# Patient Record
Sex: Female | Born: 1970 | Race: Black or African American | Hispanic: No | Marital: Single | Smoking: Never smoker
Health system: Southern US, Community
[De-identification: ages and names within clinical notes are randomized; demographics above are authoritative.]

## PROBLEM LIST (undated history)

## (undated) DIAGNOSIS — Z8639 Personal history of other endocrine, nutritional and metabolic disease: Secondary | ICD-10-CM

## (undated) DIAGNOSIS — D249 Benign neoplasm of unspecified breast: Secondary | ICD-10-CM

## (undated) DIAGNOSIS — F32A Depression, unspecified: Secondary | ICD-10-CM

## (undated) DIAGNOSIS — K219 Gastro-esophageal reflux disease without esophagitis: Secondary | ICD-10-CM

## (undated) DIAGNOSIS — D649 Anemia, unspecified: Secondary | ICD-10-CM

## (undated) DIAGNOSIS — N841 Polyp of cervix uteri: Secondary | ICD-10-CM

## (undated) DIAGNOSIS — I10 Essential (primary) hypertension: Secondary | ICD-10-CM

## (undated) DIAGNOSIS — F419 Anxiety disorder, unspecified: Secondary | ICD-10-CM

## (undated) DIAGNOSIS — D219 Benign neoplasm of connective and other soft tissue, unspecified: Secondary | ICD-10-CM

## (undated) DIAGNOSIS — N83209 Unspecified ovarian cyst, unspecified side: Secondary | ICD-10-CM

## (undated) DIAGNOSIS — G4700 Insomnia, unspecified: Secondary | ICD-10-CM

## (undated) DIAGNOSIS — R160 Hepatomegaly, not elsewhere classified: Secondary | ICD-10-CM

## (undated) DIAGNOSIS — F329 Major depressive disorder, single episode, unspecified: Secondary | ICD-10-CM

## (undated) DIAGNOSIS — Z90721 Acquired absence of ovaries, unilateral: Secondary | ICD-10-CM

## (undated) DIAGNOSIS — M1990 Unspecified osteoarthritis, unspecified site: Secondary | ICD-10-CM

## (undated) HISTORY — DX: Unspecified ovarian cyst, unspecified side: N83.209

## (undated) HISTORY — DX: Hepatomegaly, not elsewhere classified: R16.0

## (undated) HISTORY — DX: Personal history of other endocrine, nutritional and metabolic disease: Z86.39

## (undated) HISTORY — DX: Polyp of cervix uteri: N84.1

## (undated) HISTORY — DX: Benign neoplasm of unspecified breast: D24.9

## (undated) HISTORY — DX: Insomnia, unspecified: G47.00

## (undated) HISTORY — DX: Benign neoplasm of connective and other soft tissue, unspecified: D21.9

## (undated) HISTORY — PX: COLON SURGERY: SHX602

---

## 1990-02-27 HISTORY — PX: ECTOPIC PREGNANCY SURGERY: SHX613

## 2003-03-29 ENCOUNTER — Other Ambulatory Visit: Payer: Self-pay

## 2004-02-28 HISTORY — PX: MYOMECTOMY: SHX85

## 2004-12-21 ENCOUNTER — Inpatient Hospital Stay (HOSPITAL_COMMUNITY): Admission: RE | Admit: 2004-12-21 | Discharge: 2004-12-23 | Payer: Self-pay | Admitting: Obstetrics and Gynecology

## 2004-12-21 ENCOUNTER — Encounter (INDEPENDENT_AMBULATORY_CARE_PROVIDER_SITE_OTHER): Payer: Self-pay | Admitting: Specialist

## 2005-04-02 ENCOUNTER — Emergency Department: Payer: Self-pay | Admitting: Emergency Medicine

## 2005-04-10 ENCOUNTER — Other Ambulatory Visit: Admission: RE | Admit: 2005-04-10 | Discharge: 2005-04-10 | Payer: Self-pay | Admitting: Obstetrics and Gynecology

## 2005-04-19 ENCOUNTER — Emergency Department: Payer: Self-pay | Admitting: Emergency Medicine

## 2006-05-14 ENCOUNTER — Encounter: Admission: RE | Admit: 2006-05-14 | Discharge: 2006-05-14 | Payer: Self-pay | Admitting: Obstetrics and Gynecology

## 2007-12-29 DIAGNOSIS — R160 Hepatomegaly, not elsewhere classified: Secondary | ICD-10-CM

## 2007-12-29 HISTORY — DX: Hepatomegaly, not elsewhere classified: R16.0

## 2008-01-21 ENCOUNTER — Emergency Department: Payer: Self-pay | Admitting: Internal Medicine

## 2008-01-24 ENCOUNTER — Ambulatory Visit: Payer: Self-pay | Admitting: Internal Medicine

## 2008-03-30 ENCOUNTER — Ambulatory Visit: Payer: Self-pay | Admitting: Gastroenterology

## 2008-07-31 ENCOUNTER — Ambulatory Visit: Payer: Self-pay | Admitting: Gastroenterology

## 2009-06-07 ENCOUNTER — Encounter: Admission: RE | Admit: 2009-06-07 | Discharge: 2009-06-07 | Payer: Self-pay | Admitting: Obstetrics and Gynecology

## 2009-07-01 ENCOUNTER — Emergency Department: Payer: Self-pay | Admitting: Emergency Medicine

## 2009-08-09 ENCOUNTER — Encounter: Admission: RE | Admit: 2009-08-09 | Discharge: 2009-08-09 | Payer: Self-pay | Admitting: Obstetrics and Gynecology

## 2009-11-11 DIAGNOSIS — D249 Benign neoplasm of unspecified breast: Secondary | ICD-10-CM | POA: Insufficient documentation

## 2010-03-20 ENCOUNTER — Encounter: Payer: Self-pay | Admitting: Obstetrics and Gynecology

## 2010-03-22 ENCOUNTER — Encounter
Admission: RE | Admit: 2010-03-22 | Discharge: 2010-03-22 | Payer: Self-pay | Source: Home / Self Care | Attending: Obstetrics and Gynecology | Admitting: Obstetrics and Gynecology

## 2010-05-13 ENCOUNTER — Other Ambulatory Visit: Payer: Self-pay | Admitting: Obstetrics and Gynecology

## 2010-05-13 DIAGNOSIS — Z09 Encounter for follow-up examination after completed treatment for conditions other than malignant neoplasm: Secondary | ICD-10-CM

## 2010-06-13 ENCOUNTER — Other Ambulatory Visit: Payer: Self-pay

## 2010-07-15 NOTE — H&P (Signed)
NAME:  [name], BRIER NO.:  [id number]   MEDICAL RECORD NO.:  [id number]          PATIENT TYPE:  INP   LOCATION:  NA                            FACILITY:  [facility]   PHYSICIAN:  Hal Morales, M.D.DATE OF BIRTH:  1971-02-21   DATE OF ADMISSION:  12/21/2004  DATE OF DISCHARGE:                                HISTORY & PHYSICAL   HISTORY OF PRESENT ILLNESS:  [name] is a 40 year old, single, African-  American female who presents for myomectomy because of uterine fibroids,  menorrhagia, and dysmenorrhea.  For the past 3 years, the patient has  complained of menstrual periods lasting approximately 14 days.  During that  time, she changes 2 super plus tampons every 40 minutes until the last 5  days of this flow when she changes it every 4 hours.  Accompanying her  periods is severe cramping which she rates as a 10/10 on a 10-point scale;  however, the patient has chosen to simply endure this discomfort.  She  admits to dyspareunia, and today a malodorous vaginal discharge; however,  she denies any changes in bowel habits, urinary tract symptoms, or fever.  Her TSH is June 2006 was within normal limits, and a pelvic  ultrasound/sonohistogram at that time revealed 2 uterine fibroids measuring  4.9 x 4.1 x 4 cm and 2.3 x 2.4 x 1.9 cm, respectively.  A left simple  ovarian cyst measuring 3.2 cm was also observed, and the sonohistogram  failed to show any evidence of endometrial lesions.  Due to patient's  history of hypertension, she has been placed on Micronor in an effort to  curtail her bleeding and cramping; however, she has not had any change in  her symptoms whatsoever.  A review of management options for patient's  symptoms were given to include the Mirena IUD, Depo-Provera, myomectomy,  hysterectomy, and uterine artery embolization.  Since the patient desires to  preserve her fertility, she has chosen to proceed with myomectomy for  symptomatic therapy.   OBSTETRICAL HISTORY:  Gravida 2, para 1-0-1-1. The patient had a spontaneous  vaginal delivery in 1986.   GYNECOLOGICAL HISTORY:  Menarche 40 years old.  Last menstrual period  December 02, 2004 (see History of Present Illness for menstrual  characteristics).  Contraception: Micronor.  The patient does have a remote  history of an abnormal Pap smear; however, since 1992, all of her Pap smears  have been normal.  She denies any history of sexually transmitted diseases.  Her last normal Pap smear was January 2006.   PAST MEDICAL HISTORY:  1.  Hypertension.  2.  Hypercholesterolemia.  3.  Anemia.  4.  Depression.  5.  Anxiety attacks.   PAST SURGICAL HISTORY:  In 1992, laparotomy for a left ectopic pregnancy.  The patient denies any problems with anesthesia or history of blood  transfusion.   FAMILY HISTORY:  Cancer (pancreatic), hypertension, stroke, and diabetes  mellitus.   SOCIAL HISTORY:  The patient is disabled secondary to her depression and  anxiety disorders, and she is single.  Habits: She does not use alcohol or  tobacco.  CURRENT MEDICATIONS:  1.  Remeron 30 mg daily.  2.  Zoloft 50 mg daily.  3.  Iron 1 tablet daily.  4.  Micronor 1 tablet daily.  5.  Lipitor 20 mg daily.  6.  Hydrochlorothiazide 25 mg daily.   ALLERGIES:  The patient is allergic to TETRACYCLINE and SULFA, both of which  cause hives.   PHYSICAL EXAMINATION:  VITAL SIGNS: Blood pressure 114/70, weight 156,  height 4 feet 11-3/4 inches.  NECK:  Supple without masses.  There is no adenopathy or thyromegaly.  HEART: Regular rate and rhythm.  There is no murmur.  LUNGS:  Clear to auscultation.  There are no wheezes, rales, or rhonchi.  BACK: No CVA tenderness.  ABDOMEN:  Bowel sounds are present.  It is soft without tenderness,  guarding, rebound, or organomegaly.  EXTREMITIES:  Without clubbing, cyanosis, or edema.  PELVIC: EG/BUS within normal limits.  Vagina is rugose.  Cervix is nontender   without lesions.  Uterus is 14 to 15-week size without tenderness and  irregular.  Adnexa without tenderness or masses.  Rectovaginal without  tenderness or masses.   LABORATORY DATA:  Wet prep:  pH 4.5, negative whiff, negative for clue  cells, yeast, or trichomonas.   IMPRESSION:  1.  Fibroid uterus.  2.  Menorrhagia.  3.  Dysmenorrhea.  4.  Physiologic leukorrhea.   DISPOSITION:  A discussion was held with patient as previously outlined  regarding the management options for her presenting symptoms, and she has  decided to proceed with myomectomy. The patient as given a list of the risks  associated with this procedure which included but are not limited to  reaction to anesthesia, damage to adjacent organs, infection, and excessive  bleeding. The specific risk of the need for hysterectomy at the time of  intended myomectomy was likewise reviewed. The patient has consented to  proceed with abdominal myomectomy at [facility] on  December 21, 2004, at 7:30 a.m.      [name].      Hal Morales, M.D.  Electronically Signed    EJP/MEDQ  D:  12/12/2004  T:  12/12/2004  Job:  [id number]

## 2010-07-15 NOTE — Discharge Summary (Signed)
NAMEMIGUEL, MEDAL NO.:  [id number]   MEDICAL RECORD NO.:  [id number]          PATIENT TYPE:  INP   LOCATION:  9308                          [facility]ACILITY:  [facility]   PHYSICIAN:  Hal Morales, M.D.DATE OF BIRTH:  04/01/1970   DATE OF ADMISSION:  12/21/2004  DATE OF DISCHARGE:  12/23/2004                                 DISCHARGE SUMMARY   DISCHARGE DIAGNOSIS:  Fibroid uterus, menorrhagia, pelvic pain.   OPERATION:  On the day of admission the patient underwent an abdominal  myomectomy with lysis of adhesions and biopsy of a posterior uterine serosa.  The patient was found to have a uterus enlarged with two myomas measuring  4.5 cm x 5 cm on the posterior fundus, and a 2 cm x 2 cm myoma on the right  anterior fundus near the tubal junction. The right tube and ovary were  within normal limits except for follicular cyst on the right ovary. The left  tube and ovary were involved with filmy adhesions, with the adhesions  completely encasing the left ovary and adherent to the posterior uterus at  the site where the powder burn lesion consistent with endometriosis was  present.   HISTORY OF PRESENT ILLNESS:  Ms. Creekmore is a 40 year old single African-  American female para 1-0-1-1 who presents for myomectomy because of uterine  fibroids, menorrhagia and dysmenorrhea. Please see the patient's dictated  history and physical examination for details.   PREOPERATIVE PHYSICAL EXAMINATION:  VITAL SIGNS:  Blood pressure 114/70,  weight is 156, height is 4 feet 11 and three-quarter inches tall.  GENERAL:  Within normal limits.  PELVIC:  EG/BUS was within normal limits. Vagina is rugose. Cervix is  nontender without lesions. Uterus is 14-15 weeks size without tenderness and  irregular. Adnexa without tenderness or masses. Rectovaginal without  tenderness or masses.   HOSPITAL COURSE:  On the day of admission the patient underwent  aforementioned procedures, tolerating them  all well. Postoperative course  was unremarkable with the patient tolerating a postoperative hemoglobin of  9.2 (preoperative hemoglobin 12.0). By postoperative day #2, the patient had  resumed bowel and bladder function and was therefore deemed ready for  discharge home.   DISCHARGE MEDICATIONS:  1.  Iron one tablet twice daily for 6 weeks.  2.  Tylox one or two tablets every 4 hours as needed for pain.  3.  Ibuprofen 600 mg one tablet with food every 6 hours for 3 days, then as      needed for pain.  4.  Phenergan 25 mg one tablet every 6 hours for nausea.  5.  Colace 100 mg one tablet twice daily until bowel movements are regular.  6.  HydroDIURIL 25 mg daily.  7.  Lipitor 20 mg daily.  8.  Zoloft 50 mg daily.  9.  Remeron 30 mg daily.  10. Micronor one tablet daily.   FOLLOW-UP:  The patient is scheduled for a 6 weeks postoperative visit with  Dr. Pennie Rushing on February 02, 2005, at 11:30 a.m. The patient was also advised  to present to [facility] on  December 28, 2004, at 11:00 a.m. for  staple removal.   DISCHARGE INSTRUCTIONS:  The patient was given a copy of [facility] postoperative instruction sheet. She was further advised to avoid  driving for 2 weeks, heavy lifting for 4 weeks, intercourse for 6 weeks,  that she may walk up steps, that she may shower. The patient's diet was  without restriction.   FINAL PATHOLOGY:  Uterus, myomectomy:  Leiomyomata. Uterine serosa posterior  biopsy:  Fibrosis, no endometriosis identified.      [name].      Hal Morales, M.D.  Electronically Signed    EJP/MEDQ  D:  01/13/2005  T:  01/13/2005  Job:  [id number]

## 2010-07-15 NOTE — Op Note (Signed)
[name], [name] NO.:  [id number]   MEDICAL RECORD NO.:  [id number]          PATIENT TYPE:  INP   LOCATION:  9399                          FACILITY:  [facility]   PHYSICIAN:  Hal Morales, M.D.DATE OF BIRTH:  1970-03-15   DATE OF PROCEDURE:  12/21/2004  DATE OF DISCHARGE:                                 OPERATIVE REPORT   PREOPERATIVE DIAGNOSES:  Uterine fibroids, menorrhagia, pelvic pain and  anemia.   POSTOPERATIVE DIAGNOSES:  Uterine fibroids, menorrhagia, pelvic pain and  anemia.  Plus rule out endometriosis.   PROCEDURE:  Abdominal myomectomy, lysis of adhesions, biopsy of posterior  uterine serosa.   SURGEON:  Hal Morales, M.D.   FIRST ASSISTANT:  [name].   ANESTHESIA:  General orotracheal.   ESTIMATED BLOOD LOSS:  150 mL.   COMPLICATIONS:  None.   URINE OUTPUT:  200 mL   IV FLUIDS:  1500 mL.   FINDINGS:  The uterus was enlarged with two myomata, a 4.5 x 5 cm myoma on  the posterior fundus and a 2 x 2 cm myoma on the right anterior fundus near  the tubal junction. The right tube and ovary were within normal limits  except for follicular cyst on the right ovary. The left tube and ovary were  involved in filmy adhesions with the adhesions completely encasing the left  ovary and adherent to the posterior uterus at the site where a powder burn  lesion consistent with endometriosis was present.   PROCEDURE:  The patient was taken to the operating room after appropriate  identification and placed on the operating table. After the attainment of  adequate general anesthesia the abdomen, perineum and vagina were prepped  with multiple layers of Betadine. A Foley catheter was inserted into the  bladder under sterile conditions and connected to straight drainage. The  abdomen was draped as a sterile field. Injection of 15 mL of 0.25% Marcaine  at the site of the previous transverse abdominal incision was performed. A  transverse  incision was made at that site and the abdomen opened in layers.  The peritoneum was entered. The anterior peritoneal surface was adherent to  the underlying omentum and lysis of adhesions was carried out to free this.  The uterus was then freed of adhesions and elevated into the operative  field. It was surrounded with laparotomy pads. The posterior uterine serosa  was infiltrated with a dilute solution of Pitressin and incised over the  posterior myoma. A combination of sharp and blunt dissection allowed  excision of that myoma with care being taken to dissect off the endometrial  surface that was apparent. That myoma bed was then closed with interrupted  sutures of 0 Vicryl. Mattress sutures of 2-0 Vicryl allowed reapproximation  of the uterine serosa and adequate hemostasis. The right anterior fundal  myoma was then identified. The overlying serosa was injected with a dilute  solution of Pitressin and incised. A combination of blunt and sharp  dissection allowed removal of that myoma. The myoma bed was closed with  interrupted sutures of 2-0 Vicryl and the  serosa reapproximated with  mattress sutures of 2-0 Vicryl. Hemostasis was noted to be adequate. Lysis  of adhesions was then carried out around the left fallopian tube to expose  the left ovary which was normal in appearance. The powder burn lesion on the  posterior uterine serosa near the junction of the uterosacral ligaments was  then excised with hot cautery and hemostasis noted to be adequate. That was  sent for pathologic evaluation. Copious irrigation was carried out.  Intercede was used to cover the incisions in the posterior uterus and  anterior uterine serosa and the uterus returned to the peritoneal cavity.  The abdominal peritoneum was then closed with a running suture of 2-0  Vicryl. The rectus muscles were made hemostatic with Bovie cautery. After  irrigation. The rectus fascia was closed with running sutures of 0  Vicryl  from each apex to the midline and tied in the midline. The subcutaneous  tissue was made hemostatic with Bovie cautery and skin staples applied to  the skin incision. A 0.25% Marcaine was again injected the in the closed  incision for total of 15 mL. A sterile dressing was applied to the incision  and the patient awakened from general anesthesia and taken to the recovery  room in satisfactory condition having tolerated the procedure well with  sponge and instrument counts correct. Specimens to pathology; two uterine  fibroids and posterior uterine serosal biopsy.      Hal Morales, M.D.  Electronically Signed     VPH/MEDQ  D:  12/21/2004  T:  12/21/2004  Job:  [id number]

## 2010-07-28 ENCOUNTER — Ambulatory Visit
Admission: RE | Admit: 2010-07-28 | Discharge: 2010-07-28 | Disposition: A | Discharge status: Home / Self Care | Payer: Medicare Other | Source: Ambulatory Visit | Attending: Obstetrics and Gynecology | Admitting: Obstetrics and Gynecology

## 2010-07-28 DIAGNOSIS — Z09 Encounter for follow-up examination after completed treatment for conditions other than malignant neoplasm: Secondary | ICD-10-CM

## 2010-10-24 ENCOUNTER — Other Ambulatory Visit: Payer: Self-pay | Admitting: Obstetrics and Gynecology

## 2010-11-18 NOTE — Patient Instructions (Addendum)
   Your procedure is scheduled on: Tuesday, 10/2  Enter through the Main Entrance of [facility] at: 7:30am Pick up the phone at the desk and dial [phone]  Please call this number if you have any problems the morning of surgery: [phone]  Remember: Do not eat food after midnight  Do not drink clear liquids after:midnight Take these medicines the morning of surgery with a SIP OF WATER: meds: nexium, b/p medicine  Do not wear jewelry, make-up, or FINGER nail polish Do not wear lotions, powders, or perfumes.no deodorants Do not shave 48 hours prior to surgery. Do not bring valuables to the hospital. Leave suitcase in the car. After Surgery it may be brought to your room. For patients being admitted to the hospital, checkout time is 11:00am the day of discharge.    Remember to use your hibiclens as instructed. Shower with 1/2 bottle the evening before surgery and the other 1/2 bottle morning of surgery

## 2010-11-22 ENCOUNTER — Other Ambulatory Visit: Payer: Self-pay

## 2010-11-22 ENCOUNTER — Encounter (HOSPITAL_COMMUNITY)
Admission: RE | Admit: 2010-11-22 | Discharge: 2010-11-22 | Disposition: A | Discharge status: Home / Self Care | Payer: Medicare Other | Source: Ambulatory Visit | Attending: Obstetrics and Gynecology | Admitting: Obstetrics and Gynecology

## 2010-11-22 ENCOUNTER — Encounter (HOSPITAL_COMMUNITY): Payer: Self-pay

## 2010-11-22 HISTORY — DX: Gastro-esophageal reflux disease without esophagitis: K21.9

## 2010-11-22 HISTORY — DX: Anemia, unspecified: D64.9

## 2010-11-22 HISTORY — DX: Essential (primary) hypertension: I10

## 2010-11-22 HISTORY — DX: Unspecified osteoarthritis, unspecified site: M19.90

## 2010-11-22 HISTORY — DX: Anxiety disorder, unspecified: F41.9

## 2010-11-22 LAB — BASIC METABOLIC PANEL
CO2: 30 mEq/L (ref 19–32)
Calcium: 9.1 mg/dL (ref 8.4–10.5)
Chloride: 102 mEq/L (ref 96–112)
Creatinine, Ser: 0.61 mg/dL (ref 0.50–1.10)
GFR calc Af Amer: >60 mL/min (ref >60)
Potassium: 3.4 mEq/L — ABNORMAL LOW (ref 3.5–5.1)
Sodium: 138 mEq/L (ref 135–145)

## 2010-11-22 LAB — CBC
Hemoglobin: 11 g/dL — ABNORMAL LOW (ref 12.0–15.0)
MCHC: 33.2 g/dL (ref 30.0–36.0)
MCV: 89.5 fL (ref 78.0–100.0)
WBC: 4 10*3/uL (ref 4.0–10.5)

## 2010-11-22 LAB — SURGICAL PCR SCREEN: Staphylococcus aureus: NEGATIVE

## 2010-11-24 ENCOUNTER — Other Ambulatory Visit: Payer: Self-pay | Admitting: Obstetrics and Gynecology
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
## 2010-11-28 ENCOUNTER — Other Ambulatory Visit: Payer: Self-pay | Admitting: Obstetrics and Gynecology

## 2010-11-28 MED ORDER — DEXTROSE 5 % IV SOLN
2.0000 g | INTRAVENOUS | Status: AC
  Administered 2010-11-29: 2 g via INTRAVENOUS
  Filled 2010-11-28: qty 2

## 2010-11-29 ENCOUNTER — Encounter (HOSPITAL_COMMUNITY)
Admission: RE | Disposition: A | Discharge status: Home / Self Care | Payer: Self-pay | Source: Ambulatory Visit | Attending: Obstetrics and Gynecology

## 2010-11-29 ENCOUNTER — Ambulatory Visit (HOSPITAL_COMMUNITY): Payer: Medicare Other | Admitting: Anesthesiology

## 2010-11-29 ENCOUNTER — Ambulatory Visit (HOSPITAL_COMMUNITY)
Admission: RE | Admit: 2010-11-29 | Discharge: 2010-11-30 | Disposition: A | Discharge status: Home / Self Care | Payer: Medicare Other | Source: Ambulatory Visit | Attending: Obstetrics and Gynecology | Admitting: Obstetrics and Gynecology

## 2010-11-29 ENCOUNTER — Encounter (HOSPITAL_COMMUNITY): Payer: Self-pay | Admitting: Obstetrics and Gynecology

## 2010-11-29 ENCOUNTER — Encounter (HOSPITAL_COMMUNITY): Payer: Self-pay | Admitting: Anesthesiology

## 2010-11-29 ENCOUNTER — Other Ambulatory Visit: Payer: Self-pay | Admitting: Obstetrics and Gynecology

## 2010-11-29 DIAGNOSIS — D250 Submucous leiomyoma of uterus: Secondary | ICD-10-CM | POA: Insufficient documentation

## 2010-11-29 DIAGNOSIS — Z01812 Encounter for preprocedural laboratory examination: Secondary | ICD-10-CM | POA: Insufficient documentation

## 2010-11-29 DIAGNOSIS — D251 Intramural leiomyoma of uterus: Secondary | ICD-10-CM | POA: Insufficient documentation

## 2010-11-29 DIAGNOSIS — D219 Benign neoplasm of connective and other soft tissue, unspecified: Secondary | ICD-10-CM

## 2010-11-29 DIAGNOSIS — K219 Gastro-esophageal reflux disease without esophagitis: Secondary | ICD-10-CM | POA: Diagnosis present

## 2010-11-29 DIAGNOSIS — N736 Female pelvic peritoneal adhesions (postinfective): Secondary | ICD-10-CM | POA: Insufficient documentation

## 2010-11-29 DIAGNOSIS — I10 Essential (primary) hypertension: Secondary | ICD-10-CM | POA: Insufficient documentation

## 2010-11-29 DIAGNOSIS — N920 Excessive and frequent menstruation with regular cycle: Secondary | ICD-10-CM | POA: Diagnosis present

## 2010-11-29 DIAGNOSIS — D252 Subserosal leiomyoma of uterus: Secondary | ICD-10-CM | POA: Insufficient documentation

## 2010-11-29 DIAGNOSIS — Z01818 Encounter for other preprocedural examination: Secondary | ICD-10-CM | POA: Insufficient documentation

## 2010-11-29 HISTORY — PX: LAPAROSCOPIC ASSISTED VAGINAL HYSTERECTOMY: SHX5398

## 2010-11-29 HISTORY — PX: ABDOMINAL HYSTERECTOMY: SHX81

## 2010-11-29 LAB — CBC
Hemoglobin: 10 g/dL — ABNORMAL LOW (ref 12.0–15.0)
MCHC: 33 g/dL (ref 30.0–36.0)
RBC: 3.41 MIL/uL — ABNORMAL LOW (ref 3.87–5.11)
WBC: 11 10*3/uL — ABNORMAL HIGH (ref 4.0–10.5)

## 2010-11-29 SURGERY — HYSTERECTOMY, VAGINAL, LAPAROSCOPY-ASSISTED
Anesthesia: General

## 2010-11-29 MED ORDER — DIPHENHYDRAMINE HCL 50 MG/ML IJ SOLN: 12.5000 mg | Freq: Four times a day (QID) | INTRAMUSCULAR | Status: DC | PRN

## 2010-11-29 MED ORDER — HYDROMORPHONE 0.3 MG/ML IV SOLN
INTRAVENOUS | Status: AC
  Filled 2010-11-29: qty 25

## 2010-11-29 MED ORDER — LIDOCAINE HCL (CARDIAC) 20 MG/ML IV SOLN
INTRAVENOUS | Status: DC | PRN
  Administered 2010-11-29: 60 mg via INTRAVENOUS

## 2010-11-29 MED ORDER — VASOPRESSIN 20 UNIT/ML IJ SOLN
INTRAVENOUS | Status: DC | PRN
  Administered 2010-11-29: 12:00:00 via INTRAMUSCULAR

## 2010-11-29 MED ORDER — DEXAMETHASONE SODIUM PHOSPHATE 10 MG/ML IJ SOLN
INTRAMUSCULAR | Status: AC
  Filled 2010-11-29: qty 1

## 2010-11-29 MED ORDER — SCOPOLAMINE 1 MG/3DAYS TD PT72
1.0000 | MEDICATED_PATCH | Freq: Once | TRANSDERMAL | Status: DC
Start: 2010-11-29 — End: 2010-11-29
  Administered 2010-11-29: 1.5 mg via TRANSDERMAL

## 2010-11-29 MED ORDER — LACTATED RINGERS IV SOLN
INTRAVENOUS | Status: DC
  Administered 2010-11-29: 125 mL/h via INTRAVENOUS
  Administered 2010-11-29: via INTRAVENOUS

## 2010-11-29 MED ORDER — DIPHENHYDRAMINE HCL 12.5 MG/5ML PO ELIX: 12.5000 mg | ORAL_SOLUTION | Freq: Four times a day (QID) | ORAL | Status: DC | PRN

## 2010-11-29 MED ORDER — HYDROMORPHONE 0.3 MG/ML IV SOLN
INTRAVENOUS | Status: DC
  Administered 2010-11-29: 0.799 mg via INTRAVENOUS
  Administered 2010-11-29: 16:00:00 via INTRAVENOUS
  Administered 2010-11-29: 0.65 mL via INTRAVENOUS
  Administered 2010-11-30: 0.599 mg via INTRAVENOUS
  Administered 2010-11-30: 0.8 mg via INTRAVENOUS

## 2010-11-29 MED ORDER — SODIUM CHLORIDE 0.9 % IJ SOLN: 9.0000 mL | INTRAMUSCULAR | Status: DC | PRN

## 2010-11-29 MED ORDER — MIDAZOLAM HCL 2 MG/2ML IJ SOLN
INTRAMUSCULAR | Status: AC
  Filled 2010-11-29: qty 2

## 2010-11-29 MED ORDER — ONDANSETRON HCL 4 MG/2ML IJ SOLN
INTRAMUSCULAR | Status: DC | PRN
  Administered 2010-11-29: 4 mg via INTRAVENOUS

## 2010-11-29 MED ORDER — MENTHOL 3 MG MT LOZG: 1.0000 | LOZENGE | OROMUCOSAL | Status: DC | PRN

## 2010-11-29 MED ORDER — MEPERIDINE HCL 25 MG/ML IJ SOLN: 6.2500 mg | INTRAMUSCULAR | Status: DC | PRN

## 2010-11-29 MED ORDER — PROMETHAZINE HCL 25 MG RE SUPP: 12.5000 mg | Freq: Four times a day (QID) | RECTAL | Status: DC | PRN

## 2010-11-29 MED ORDER — HYDROCHLOROTHIAZIDE 25 MG PO TABS
25.0000 mg | ORAL_TABLET | Freq: Every day | ORAL | Status: DC
  Administered 2010-11-30: 25 mg via ORAL
  Filled 2010-11-29 (×2): qty 1

## 2010-11-29 MED ORDER — FENTANYL CITRATE 0.05 MG/ML IJ SOLN
INTRAMUSCULAR | Status: AC
  Administered 2010-11-29: 50 ug via INTRAVENOUS
  Filled 2010-11-29: qty 2

## 2010-11-29 MED ORDER — PROPOFOL 10 MG/ML IV EMUL
INTRAVENOUS | Status: AC
  Filled 2010-11-29: qty 20

## 2010-11-29 MED ORDER — METOCLOPRAMIDE HCL 5 MG/ML IJ SOLN: 10.0000 mg | Freq: Once | INTRAMUSCULAR | Status: DC | PRN

## 2010-11-29 MED ORDER — BUPIVACAINE HCL (PF) 0.25 % IJ SOLN
INTRAMUSCULAR | Status: DC | PRN
  Administered 2010-11-29: 10 mL

## 2010-11-29 MED ORDER — SIMVASTATIN 40 MG PO TABS
40.0000 mg | ORAL_TABLET | Freq: Every day | ORAL | Status: DC
  Administered 2010-11-29: 40 mg via ORAL
  Filled 2010-11-29 (×2): qty 1

## 2010-11-29 MED ORDER — SCOPOLAMINE 1 MG/3DAYS TD PT72
MEDICATED_PATCH | TRANSDERMAL | Status: AC
  Administered 2010-11-29: 1.5 mg via TRANSDERMAL
  Filled 2010-11-29: qty 1

## 2010-11-29 MED ORDER — PANTOPRAZOLE SODIUM 40 MG PO TBEC
80.0000 mg | DELAYED_RELEASE_TABLET | Freq: Every day | ORAL | Status: DC
  Administered 2010-11-30: 80 mg via ORAL
  Filled 2010-11-29 (×3): qty 2

## 2010-11-29 MED ORDER — PROPOFOL 10 MG/ML IV EMUL
INTRAVENOUS | Status: DC | PRN
  Administered 2010-11-29: 50 mg via INTRAVENOUS
  Administered 2010-11-29: 150 mg via INTRAVENOUS
  Administered 2010-11-29: 50 mg via INTRAVENOUS

## 2010-11-29 MED ORDER — FENTANYL CITRATE 0.05 MG/ML IJ SOLN
INTRAMUSCULAR | Status: AC
  Filled 2010-11-29: qty 5

## 2010-11-29 MED ORDER — GLYCOPYRROLATE 0.2 MG/ML IJ SOLN
INTRAMUSCULAR | Status: DC | PRN
  Administered 2010-11-29: 0.2 mg via INTRAVENOUS

## 2010-11-29 MED ORDER — FENTANYL CITRATE 0.05 MG/ML IJ SOLN
25.0000 ug | INTRAMUSCULAR | Status: DC | PRN
  Administered 2010-11-29: 50 ug via INTRAVENOUS

## 2010-11-29 MED ORDER — NEOSTIGMINE METHYLSULFATE 1 MG/ML IJ SOLN
INTRAMUSCULAR | Status: AC
  Filled 2010-11-29: qty 10

## 2010-11-29 MED ORDER — FENTANYL CITRATE 0.05 MG/ML IJ SOLN
INTRAMUSCULAR | Status: DC | PRN
  Administered 2010-11-29 (×2): 100 ug via INTRAVENOUS
  Administered 2010-11-29 (×2): 50 ug via INTRAVENOUS
  Administered 2010-11-29: 150 ug via INTRAVENOUS

## 2010-11-29 MED ORDER — HYDROCODONE-ACETAMINOPHEN 5-325 MG PO TABS
1.0000 | ORAL_TABLET | Freq: Four times a day (QID) | ORAL | Status: DC | PRN
  Administered 2010-11-30: 1 via ORAL
  Filled 2010-11-29: qty 1

## 2010-11-29 MED ORDER — HYDROMORPHONE HCL 1 MG/ML IJ SOLN
INTRAMUSCULAR | Status: AC
  Filled 2010-11-29: qty 1

## 2010-11-29 MED ORDER — LACTATED RINGERS IV SOLN
INTRAVENOUS | Status: DC
  Administered 2010-11-29 (×3): via INTRAVENOUS

## 2010-11-29 MED ORDER — LIDOCAINE HCL (CARDIAC) 20 MG/ML IV SOLN
INTRAVENOUS | Status: AC
  Filled 2010-11-29: qty 5

## 2010-11-29 MED ORDER — ONDANSETRON HCL 4 MG/2ML IJ SOLN: 4.0000 mg | Freq: Four times a day (QID) | INTRAMUSCULAR | Status: DC | PRN

## 2010-11-29 MED ORDER — EZETIMIBE 10 MG PO TABS
10.0000 mg | ORAL_TABLET | Freq: Every day | ORAL | Status: DC
  Administered 2010-11-29: 10 mg via ORAL
  Filled 2010-11-29 (×2): qty 1

## 2010-11-29 MED ORDER — PROMETHAZINE HCL 25 MG PO TABS: 12.5000 mg | ORAL_TABLET | Freq: Four times a day (QID) | ORAL | Status: DC | PRN

## 2010-11-29 MED ORDER — HYDROMORPHONE HCL 1 MG/ML IJ SOLN
INTRAMUSCULAR | Status: DC | PRN
  Administered 2010-11-29: 1 mg via INTRAVENOUS

## 2010-11-29 MED ORDER — IBUPROFEN 600 MG PO TABS: 600.0000 mg | ORAL_TABLET | Freq: Four times a day (QID) | ORAL | Status: DC | PRN

## 2010-11-29 MED ORDER — EZETIMIBE-SIMVASTATIN 10-40 MG PO TABS: 1.0000 | ORAL_TABLET | Freq: Every day | ORAL | Status: DC

## 2010-11-29 MED ORDER — DOCUSATE SODIUM 100 MG PO CAPS
100.0000 mg | ORAL_CAPSULE | Freq: Every day | ORAL | Status: DC
  Administered 2010-11-30: 100 mg via ORAL
  Filled 2010-11-29: qty 1

## 2010-11-29 MED ORDER — GLYCOPYRROLATE 0.2 MG/ML IJ SOLN
INTRAMUSCULAR | Status: AC
  Filled 2010-11-29: qty 1

## 2010-11-29 MED ORDER — MIDAZOLAM HCL 5 MG/5ML IJ SOLN
INTRAMUSCULAR | Status: DC | PRN
  Administered 2010-11-29: 2 mg via INTRAVENOUS

## 2010-11-29 MED ORDER — KETOROLAC TROMETHAMINE 30 MG/ML IJ SOLN
INTRAMUSCULAR | Status: AC
  Filled 2010-11-29: qty 1

## 2010-11-29 MED ORDER — PROMETHAZINE HCL 25 MG/ML IJ SOLN: 12.5000 mg | Freq: Four times a day (QID) | INTRAMUSCULAR | Status: DC | PRN

## 2010-11-29 MED ORDER — KETOROLAC TROMETHAMINE 30 MG/ML IJ SOLN
INTRAMUSCULAR | Status: DC | PRN
  Administered 2010-11-29: 30 mg via INTRAVENOUS

## 2010-11-29 MED ORDER — ESTRADIOL 0.1 MG/GM VA CREA
TOPICAL_CREAM | VAGINAL | Status: DC | PRN
  Administered 2010-11-29: 1 via VAGINAL

## 2010-11-29 MED ORDER — ROCURONIUM BROMIDE 100 MG/10ML IV SOLN
INTRAVENOUS | Status: DC | PRN
  Administered 2010-11-29: 50 mg via INTRAVENOUS

## 2010-11-29 MED ORDER — NALOXONE HCL 0.4 MG/ML IJ SOLN: 0.4000 mg | INTRAMUSCULAR | Status: DC | PRN

## 2010-11-29 MED ORDER — NEOSTIGMINE METHYLSULFATE 1 MG/ML IJ SOLN
INTRAMUSCULAR | Status: DC | PRN
  Administered 2010-11-29: 1 mg via INTRAVENOUS

## 2010-11-29 MED ORDER — HYDROXYZINE HCL 25 MG PO TABS
25.0000 mg | ORAL_TABLET | Freq: Three times a day (TID) | ORAL | Status: DC | PRN
  Filled 2010-11-29: qty 1

## 2010-11-29 MED ORDER — DEXAMETHASONE SODIUM PHOSPHATE 10 MG/ML IJ SOLN
INTRAMUSCULAR | Status: DC | PRN
  Administered 2010-11-29: 10 mg via INTRAVENOUS

## 2010-11-29 MED ORDER — ROCURONIUM BROMIDE 50 MG/5ML IV SOLN
INTRAVENOUS | Status: AC
  Filled 2010-11-29: qty 1

## 2010-11-29 MED ORDER — ONDANSETRON HCL 4 MG/2ML IJ SOLN
INTRAMUSCULAR | Status: AC
  Filled 2010-11-29: qty 2

## 2010-11-29 SURGICAL SUPPLY — 78 items
ADH SKN CLS APL DERMABOND .7 (GAUZE/BANDAGES/DRESSINGS) ×2
BLADE HEX COATED 2.75 (ELECTRODE) IMPLANT
CABLE HIGH FREQUENCY MONO STRZ (ELECTRODE) IMPLANT
CANISTER SUCTION 2500CC (MISCELLANEOUS) ×3 IMPLANT
CATH ROBINSON RED A/P 16FR (CATHETERS) IMPLANT
CHLORAPREP W/TINT 26ML (MISCELLANEOUS) ×3 IMPLANT
CLOTH BEACON ORANGE TIMEOUT ST (SAFETY) ×3 IMPLANT
CONT PATH 16OZ SNAP LID 3702 (MISCELLANEOUS) ×3 IMPLANT
CONT SPECI 4OZ STER CLIK (MISCELLANEOUS) IMPLANT
COVER TABLE BACK 60X90 (DRAPES) ×3 IMPLANT
DECANTER SPIKE VIAL GLASS SM (MISCELLANEOUS) IMPLANT
DERMABOND ADVANCED (GAUZE/BANDAGES/DRESSINGS) ×1
DERMABOND ADVANCED .7 DNX12 (GAUZE/BANDAGES/DRESSINGS) ×1 IMPLANT
DRAIN JACKSON PRT FLT 7MM (DRAIN) IMPLANT
DRAPE PROXIMA HALF (DRAPES) ×3 IMPLANT
DRAPE UTILITY XL STRL (DRAPES) ×3 IMPLANT
DRSG COVADERM PLUS 2X2 (GAUZE/BANDAGES/DRESSINGS) ×2 IMPLANT
ELECT NDL TIP 2.8 STRL (NEEDLE) IMPLANT
ELECT NEEDLE TIP 2.8 STRL (NEEDLE) IMPLANT
ELECT REM PT RETURN 9FT ADLT (ELECTROSURGICAL) ×3
ELECTRODE REM PT RTRN 9FT ADLT (ELECTROSURGICAL) ×2 IMPLANT
EVACUATOR SILICONE 100CC (DRAIN) IMPLANT
EVACUATOR SMOKE 8.L (FILTER) ×3 IMPLANT
FORCEPS CUTTING 33CM 5MM (CUTTING FORCEPS) IMPLANT
FORCEPS CUTTING 45CM 5MM (CUTTING FORCEPS) IMPLANT
GAUZE PACKING 2X5 YD STERILE (GAUZE/BANDAGES/DRESSINGS) ×2 IMPLANT
GAUZE SPONGE 4X4 16PLY XRAY LF (GAUZE/BANDAGES/DRESSINGS) ×3 IMPLANT
GLOVE BIOGEL PI IND STRL 6.5 (GLOVE) ×2 IMPLANT
GLOVE BIOGEL PI INDICATOR 6.5 (GLOVE) ×1
GLOVE SURG SS PI 6.5 STRL IVOR (GLOVE) ×9 IMPLANT
GOWN PREVENTION PLUS LG XLONG (DISPOSABLE) ×12 IMPLANT
NDL HYPO 25X1 1.5 SAFETY (NEEDLE) IMPLANT
NDL SPNL 22GX3.5 QUINCKE BK (NEEDLE) ×1 IMPLANT
NEEDLE HYPO 25X1 1.5 SAFETY (NEEDLE) IMPLANT
NEEDLE MAYO .5 CIRCLE (NEEDLE) ×3 IMPLANT
NEEDLE SPNL 22GX3.5 QUINCKE BK (NEEDLE) ×3 IMPLANT
NS IRRIG 1000ML POUR BTL (IV SOLUTION) ×3 IMPLANT
PACK ABDOMINAL GYN (CUSTOM PROCEDURE TRAY) ×3 IMPLANT
PACK LAVH (CUSTOM PROCEDURE TRAY) ×3 IMPLANT
PAD MAGNETIC INST (MISCELLANEOUS) ×2 IMPLANT
PAD OB MATERNITY 4.3X12.25 (PERSONAL CARE ITEMS) ×3 IMPLANT
SCALPEL HARMONIC ACE (MISCELLANEOUS) IMPLANT
SET IRRIG TUBING LAPAROSCOPIC (IRRIGATION / IRRIGATOR) ×2 IMPLANT
SOLUTION ELECTROLUBE (MISCELLANEOUS) IMPLANT
SPONGE LAP 18X18 X RAY DECT (DISPOSABLE) ×6 IMPLANT
STAPLER VISISTAT 35W (STAPLE) IMPLANT
STRIP CLOSURE SKIN 1/4X3 (GAUZE/BANDAGES/DRESSINGS) IMPLANT
SUT CHROMIC 2 0 TIES 18 (SUTURE) IMPLANT
SUT MNCRL AB 3-0 PS2 27 (SUTURE) IMPLANT
SUT PDS AB 1 CT  36 (SUTURE)
SUT PDS AB 1 CT 36 (SUTURE) IMPLANT
SUT PLAIN 2 0 XLH (SUTURE) IMPLANT
SUT SILK 0 FSL (SUTURE) IMPLANT
SUT VIC AB 0 CT1 18XCR BRD8 (SUTURE) ×6 IMPLANT
SUT VIC AB 0 CT1 27 (SUTURE)
SUT VIC AB 0 CT1 27XBRD ANBCTR (SUTURE) IMPLANT
SUT VIC AB 0 CT1 8-18 (SUTURE) ×9
SUT VIC AB 2-0 CT1 (SUTURE) ×3 IMPLANT
SUT VIC AB 2-0 SH 27 (SUTURE) ×6
SUT VIC AB 2-0 SH 27XBRD (SUTURE) ×4 IMPLANT
SUT VIC AB 3-0 PS2 18 (SUTURE) ×3
SUT VIC AB 3-0 PS2 18XBRD (SUTURE) ×2 IMPLANT
SUT VIC AB 3-0 SH 27 (SUTURE)
SUT VIC AB 3-0 SH 27X BRD (SUTURE) IMPLANT
SUT VICRYL 0 ENDOLOOP (SUTURE) IMPLANT
SUT VICRYL 0 TIES 12 18 (SUTURE) ×3 IMPLANT
SUT VICRYL 0 UR6 27IN ABS (SUTURE) IMPLANT
SYR 20CC LL (SYRINGE) ×3 IMPLANT
SYR 50ML LL SCALE MARK (SYRINGE) ×3 IMPLANT
SYR CONTROL 10ML LL (SYRINGE) IMPLANT
SYR TB 1ML 25GX5/8 (SYRINGE) IMPLANT
SYR TB 1ML LUER SLIP (SYRINGE) ×3 IMPLANT
TOWEL OR 17X24 6PK STRL BLUE (TOWEL DISPOSABLE) ×6 IMPLANT
TRAY FOLEY CATH 14FR (SET/KITS/TRAYS/PACK) ×3 IMPLANT
TROCAR BALL TOP DISP 5MM (ENDOMECHANICALS) ×3 IMPLANT
TROCAR Z-THREAD BLADED 11X100M (TROCAR) ×3 IMPLANT
WARMER LAPAROSCOPE (MISCELLANEOUS) ×3 IMPLANT
WATER STERILE IRR 1000ML POUR (IV SOLUTION) ×3 IMPLANT

## 2010-11-29 NOTE — Anesthesia Procedure Notes (Signed)
Procedure Name: Intubation Date/Time: 11/29/2010 9:27 AM Performed by: Doreene Burke Pre-anesthesia Checklist: Patient identified, Emergency Drugs available, Suction available and Patient being monitored Patient Re-evaluated:Patient Re-evaluated prior to inductionOxygen Delivery Method: Circle System Utilized Preoxygenation: Pre-oxygenation with 100% oxygen Intubation Type: IV induction Ventilation: Mask ventilation without difficulty Laryngoscope Size: Miller and 2 Grade View: Grade II Tube type: Oral Tube size: 7.0 mm Number of attempts: 1 Airway Equipment and Method: video-laryngoscopy and stylet (Attempt to visualize with Miller 2, poor view. Glidescope ) Placement Confirmation: ETT inserted through vocal cords under direct vision,  positive ETCO2 and breath sounds checked- equal and bilateral Secured at: 21 cm Tube secured with: Tape Dental Injury: Teeth and Oropharynx as per pre-operative assessment  Difficulty Due To: Difficulty was unanticipated and Difficult Airway- due to limited oral opening Future Recommendations: Recommend- induction with short-acting agent, and alternative techniques readily available

## 2010-11-29 NOTE — Anesthesia Postprocedure Evaluation (Signed)
  Anesthesia Post-op Note  Patient: [name]  Procedure(s) Performed:  LAPAROSCOPIC ASSISTED VAGINAL HYSTERECTOMY  Patient Location: PACU  Anesthesia Type: General  Level of Consciousness: awake, alert  and oriented  Airway and Oxygen Therapy: Patient Spontanous Breathing  Post-op Pain: none  Post-op Assessment: Post-op Vital signs reviewed, Patient's Cardiovascular Status Stable, Respiratory Function Stable, Patent Airway, No signs of Nausea or vomiting and Pain level controlled  Post-op Vital Signs: Reviewed and stable  Complications: No apparent anesthesia complications

## 2010-11-29 NOTE — Brief Op Note (Signed)
  11/29/2010  1:07 PM  PATIENT:  [name]  40 y.o. female  PRE-OPERATIVE DIAGNOSIS:  Menorrhagia, Fibroids, Prior Myomectomy  POST-OPERATIVE DIAGNOSIS:  Menorrhagia, Fibroids, Prior Myomectomy, Lysis of Adhesions  PROCEDURE:  Procedure(s): LAPAROSCOPIC ASSISTED VAGINAL HYSTERECTOMY With Lysis of Adhesions  SURGEON:  Surgeon(s): Hal Morales, MD   ASSISTANTS: Henreitta Leber PA-C   ANESTHESIA:   local and general  LOCAL MEDICATIONS USED:  MARCAINE  10 CC Pitressin: 25 CC  SPECIMEN:  Uterus and cervix  DISPOSITION OF SPECIMEN:  PATHOLOGY  COUNTS:  YES  ESTIMATED BLOOD LOSS: * No blood loss amount entered *   PATIENT DISPOSITION:  PACU - hemodynamically stable.   Dictation # [id number]    POWELL,ELMIRA PA-C 11/29/2010 1:07 PM

## 2010-11-29 NOTE — Progress Notes (Signed)
Encounter addended by: Suella Grove on: 11/29/2010  5:32 PM<BR>     Documentation filed: Notes Section

## 2010-11-29 NOTE — Preoperative (Signed)
Beta Blockers   Reason not to administer Beta Blockers:Not Applicable 

## 2010-11-29 NOTE — Anesthesia Postprocedure Evaluation (Signed)
  Anesthesia Post-op Note  Patient: [name]  Procedure(s) Performed:  LAPAROSCOPIC ASSISTED VAGINAL HYSTERECTOMY  Patient Location: PACU and Women's Unit  Anesthesia Type: General  Level of Consciousness: awake, alert , oriented and patient cooperative  Airway and Oxygen Therapy: Patient Spontanous Breathing  Post-op Pain: none  Post-op Assessment: Post-op Vital signs reviewed, Patient's Cardiovascular Status Stable, Respiratory Function Stable, No signs of Nausea or vomiting, Adequate PO intake and Pain level controlled  Post-op Vital Signs: Reviewed and stable  Complications: No apparent anesthesia complications

## 2010-11-29 NOTE — Transfer of Care (Signed)
Immediate Anesthesia Transfer of Care Note  Patient: [name]  Procedure(s) Performed:  LAPAROSCOPIC ASSISTED VAGINAL HYSTERECTOMY  Patient Location: PACU  Anesthesia Type: General  Level of Consciousness: awake and patient cooperative  Airway & Oxygen Therapy: Patient Spontanous Breathing and Patient connected to nasal cannula oxygen  Post-op Assessment: Report given to PACU RN and Post -op Vital signs reviewed and stable  Post vital signs: Reviewed  Complications: No apparent anesthesia complications

## 2010-11-29 NOTE — Anesthesia Preprocedure Evaluation (Addendum)
Anesthesia Evaluation  Name, MR# and DOB Patient awake  General Assessment Comment  Reviewed: Allergy & Precautions, H&P , NPO status , Patient's Chart, lab work & pertinent test results  Airway Mallampati: II TM Distance: >3 FB Neck ROM: Full    Dental No notable dental hx. (+) Teeth Intact   Pulmonary  clear to auscultation  Pulmonary exam normal       Cardiovascular hypertension, Pt. on medications Regular Normal    Neuro/Psych Negative Neurological ROS  Negative Psych ROS   GI/Hepatic negative GI ROS Neg liver ROS  GERD Controlled and Medicated  Endo/Other  Negative Endocrine ROS  Renal/GU negative Renal ROS  Genitourinary negative   Musculoskeletal negative musculoskeletal ROS (+)   Abdominal Normal abdominal exam  (+)   Peds  Hematology negative hematology ROS (+)   Anesthesia Other Findings   Reproductive/Obstetrics negative OB ROS                           Anesthesia Physical Anesthesia Plan  ASA: II  Anesthesia Plan: General   Post-op Pain Management:    Induction: Intravenous  Airway Management Planned: Oral ETT  Additional Equipment:   Intra-op Plan:   Post-operative Plan: Extubation in OR  Informed Consent: I have reviewed the patients History and Physical, chart, labs and discussed the procedure including the risks, benefits and alternatives for the proposed anesthesia with the patient or authorized representative who has indicated his/her understanding and acceptance.   Dental advisory given  Plan Discussed with: Anesthesiologist and CRNA  Anesthesia Plan Comments:       Anesthesia Quick Evaluation

## 2010-11-29 NOTE — H&P (Signed)
No change in  H& P

## 2010-11-29 NOTE — H&P (Signed)
Note reviewed and noted to be complete

## 2010-11-30 MED ORDER — PROMETHAZINE HCL 12.5 MG PO TABS: 12.5000 mg | ORAL_TABLET | Freq: Four times a day (QID) | ORAL | Status: AC | PRN

## 2010-11-30 MED ORDER — DOCUSATE SODIUM 100 MG PO CAPS: 100.0000 mg | ORAL_CAPSULE | Freq: Two times a day (BID) | ORAL | Status: AC

## 2010-11-30 MED ORDER — HYDROCODONE-ACETAMINOPHEN 5-325 MG PO TABS: 1.0000 | ORAL_TABLET | Freq: Four times a day (QID) | ORAL | Status: AC | PRN

## 2010-11-30 MED ORDER — IBUPROFEN 600 MG PO TABS: 600.0000 mg | ORAL_TABLET | Freq: Four times a day (QID) | ORAL | Status: AC

## 2010-11-30 MED ORDER — MENTHOL 3 MG MT LOZG: 1.0000 | LOZENGE | OROMUCOSAL | Status: DC | PRN

## 2010-11-30 NOTE — Discharge Summary (Signed)
Physician Discharge Summary  Patient ID: [name] MRN: [id number] DOB/AGE: [birth year] 40 y.o.  Admit date: 11/29/2010 Discharge date: 11/30/2010  Admission Diagnoses:Menorrhagia, Symptomatic Uterine Fibroids and S/P Abdominal Myomectomy  Discharge Diagnoses:  Active Problems:  Menorrhagia  Fibroids  GERD (gastroesophageal reflux disease) Pelvic Adhesions  Discharged Condition: stable  Hospital Course: Date of admission the patient underwent a laparoscopically assisted vaginal hysterectomy with lysis of adhesions tolerating procedure well.  By post operative day number one, she had resumed bowel and bladder functions and achieved adequate pain control with oral analgesia and was therefore deemed ready for discharge home.  Consults: none  Significant Diagnostic Studies: none  Treatments: Laparoscopically assisted vaginal hysterectomy with lysis of adhesions.  Discharge Exam: Blood pressure 137/67, pulse 79, temperature 98.5 F (36.9 C), temperature source Oral, resp. rate 18, height 5' (1.524 m), weight 56.7 kg (125 lb), SpO2 97.00%. Resp: clear to auscultation bilaterally Cardio: regular rate and rhythm GI: soft, bowel sounds present and appropriately tender Extremities: Homans sign is negative, no sign of DVT Incision/Wound:wound edges well approximated without evidence of infection  Disposition:   Discharge Orders    Future Orders Please Complete By Expires   Discontinue IV        Current Discharge Medication List    START taking these medications   Details  docusate sodium (COLACE) 100 MG capsule Take 1 capsule (100 mg total) by mouth 2 (two) times daily. To prevent constipation, take as directed while taking hydrocodone-acetamniophen Qty: 60 capsule, Refills: 2    HYDROcodone-acetaminophen (NORCO) 5-325 MG per tablet Take 1-2 tablets by mouth every 6 (six) hours as needed for pain. Qty: 30 tablet, Refills: 0    ibuprofen (ADVIL,MOTRIN) 600 MG tablet Take 1  tablet (600 mg total) by mouth every 6 (six) hours. Take ibuprofen as prescribed with food for the next 72 hours and then as needed for pain. Qty: 30 tablet, Refills: 1    promethazine (PHENERGAN) 12.5 MG tablet Take 1-2 tablets (12.5-25 mg total) by mouth every 6 (six) hours as needed for nausea. Qty: 30 tablet, Refills: 0      CONTINUE these medications which have NOT CHANGED   Details  esomeprazole (NEXIUM) 40 MG capsule Take 40 mg by mouth daily before breakfast.      hydrochlorothiazide (HYDRODIURIL) 25 MG tablet Take 25 mg by mouth daily.      ezetimibe-simvastatin (VYTORIN) 10-40 MG per tablet Take 1 tablet by mouth at bedtime.      hydrOXYzine (ATARAX/VISTARIL) 25 MG tablet Take 25 mg by mouth 3 (three) times daily as needed. For pain.        Follow-up Information    Follow up with Hal Morales, MD on 01/10/2011. (Your appointment is at 3 p.m. on January 10, 2011)    Contact information:   [address] [place] [address] [phone]          Signed: Patrick Jupiter 11/30/2010, 8:10 AM

## 2010-11-30 NOTE — Progress Notes (Signed)
1 Day Post-Op Procedure(s) (LRB): LAPAROSCOPIC ASSISTED VAGINAL HYSTERECTOMY (N/A)  Subjective: Patient reports that pain is well managed.  Tolerating clear liquids  diet without difficulty. No nausea / vomiting.  Ambulating without difficulty.  Objective: BP 137/67  Pulse 79  Temp(Src) 98.5 F (36.9 C) (Oral)  Resp 18  Ht 5' (1.524 m)  Wt 56.7 kg (125 lb)  BMI 24.41 kg/m2  SpO2 97% Lungs: clear Heart: normal rate and rhythm Abdomen:soft and appropriately tender, perineal pad-clean Extremities: Homans sign is negative, no sign of DVT Incision: healing well  Assessment: s/p Procedure(s): LAPAROSCOPIC ASSISTED VAGINAL HYSTERECTOMY with Lysis of Adhesions: stable  Plan: Advance diet Advance to PO medication Discontinue IV fluids Discharge home  LOS: 1 day    Aaiden Depoy, PA-C 11/30/2010, 7:25 AM

## 2010-12-02 NOTE — Op Note (Signed)
[name], QUANT NO.:  [id number]  MEDICAL RECORD NO.:  [id number]  LOCATION:  9319                          FACILITY:  [facility]  PHYSICIAN:  Hal Morales, M.D.DATE OF BIRTH:  1971-02-02  DATE OF PROCEDURE:  11/29/2010 DATE OF DISCHARGE:  11/30/2010                              OPERATIVE REPORT   PREOPERATIVE DIAGNOSES:  Menorrhagia, uterine fibroids, status post prior myomectomy.  POSTOPERATIVE DIAGNOSES:  Menorrhagia, uterine fibroids, status post prior myomectomy, significant adhesions.  PROCEDURE:  Laparoscopically-assisted vaginal hysterectomy with extensive lysis of adhesions.  SURGEON:  Hal Morales, MD  FIRST ASSISTANT:  Elmira J. Lowell Guitar, certified Advice worker.  ANESTHESIA:  General orotracheal.  ESTIMATED BLOOD LOSS:  300 mL.  COMPLICATIONS:  None.  FINDINGS:  The uterus was enlarged with multiple myomata.  There were extensive adhesions between the myomatous uterus and the overlying bowel epiploica.  The left tube was status post salpingectomy for tubal ectopic pregnancy and extensive adhesions existed between the left cornua of the uterus and the left pelvic sidewall.  The right ovary contained a simple 2-cm cyst.  The right tube was within normal limits. The left ovary was very beneath the adhesive bowel and was adherent to the left pelvic sidewall.  PROCEDURE:  The patient was taken to the operating room after appropriate identification and after a discussion which had included the indications for her procedure as well as the risks involved, which include but are not limited to anesthesia, bleeding, infection and damage to adjacent organs.  She was placed on the operating table. After the attainment of adequate general anesthesia, she was placed in the modified lithotomy position.  The abdomen was prepped with ChloraPrep and the perineum and vagina prepped with Betadine.  A Foley catheter was inserted under sterile  conditions and connected to straight drainage.  A Hulka tenaculum was placed on the anterior cervix.  The abdomen and perineum were draped as a sterile field.  Subumbilical and suprapubic injections of 0.25% Marcaine for a total of 10 mL was undertaken.  A subumbilical incision was made and the Veress cannula placed through that incision into the peritoneal cavity under direct visualization.  A pneumoperitoneum was created with 4 liters of CO2.  The Veress cannula was removed and the laparoscopic trocar placed through the subumbilical incision into the perineal cavity.  The laparoscope was placed through the trocar sleeve.  Suprapubic incisions to the right and left of midline were placed and laparoscopic probe, trocars placed through those incisions into the peritoneal cavity under direct visualization.  The above-noted findings were made and documented.  The lysis of adhesions between the bowel and the underlying uterus was undertaken with a combination of sharp, blunt and hydrodissection until the bowel was completely freed from the uterus and the uterus freed from the left pelvic sidewall.  The round ligament was identified on that side and cauterized, then cut.  The anterior leaf of the broad ligament was then incised toward the bladder.  The uterus was further dissected off the left pelvic sidewall.  A similar procedure was carried out with the round ligaments on the right side and the anterior leaf of the broad ligament being  cut to meet the other side creating the bladder flap.  The tuboovarian junction and utero-ovarian ligament were then cauterized and cut separately, disconnecting the adnexa from the uterus.  Once the uterus had been completely freed anteriorly and posteriorly from its adhesions and the bladder flap created, the pneumoperitoneum was allowed to escape and the perineum became the site of operation.  A weighted speculum was placed in the posterior vagina and  Lahey tenaculum was placed on the anterior and posterior surfaces of the cervix.  The cervicovaginal mucosa was injected with a dilute solution of Pitressin for a total of 25 mL.  The cervix was circumscribed and the anterior vaginal mucosa dissected off the anterior cervix with a combination of blunt and sharp dissection.  The anterior peritoneal cavity was entered and the bladder blade placed.  The posterior peritoneum was entered sharply and tagged.  The uterosacral ligaments on the right and left side were then clamped, cut and suture ligated with those sutures being held.  The paracervical tissues and parametrial tissues were successively clamped, cut, and suture ligated. The uterus was inverted and the upper parametrial tissues clamped, cut and suture ligated on either side freeing the uterus and allowing it to be removed from the operative field.  These upper ligaments were then suture ligated and tied.  The tied portions were inspected and hemostasis found to be adequate.  The McCall culdoplasty suture was placed in the posterior cul-de-sac incorporating the uterosacral ligaments on either side and the intervening posterior peritoneum.  That suture was held.  The vaginal angles were then created with the uterosacral ligament sutures being placed anteriorly and posteriorly and the vaginal cuff then tied down at the angle.  The remainder of the vaginal cuff was closed in a single layer incorporating the anterior vaginal mucosa, anterior peritoneum, posterior peritoneum, and posterior vaginal mucosa in a figure-of-eight suture all of 0 Vicryl as had the rest of the sutures been.  The vaginal cuff was completely closed in this fashion and the [facility] culdoplasty suture tied down.  The vagina was then packed with 1-inch packing moistened with Estrace cream.  The surgeons changed gowns and gloves and recreated a pneumoperitoneum.  The laparoscope was used to investigate the cut surfaces  of the pelvis and all areas were found to be hemostatic.  The pelvis was copiously irrigated and a small amount of warm lactated Ringer's left in the peritoneal cavity.  All instruments were then removed from the peritoneal cavity under direct visualization as the CO2 was allowed to escape.  The subumbilical incision was closed with a fascial suture of 0 Vicryl in a figure-of-eight fashion.  The skin incisions for the suprapubic and subumbilical incisions were closed with Dermabond.  The patient was then awakened from general anesthesia and taken to the recovery room in satisfactory condition having tolerated the procedure well with sponge and instrument counts correct.  SPECIMENS TO PATHOLOGY:  Uterus and cervix.     Hal Morales, M.D.     VPH/MEDQ  D:  12/01/2010  T:  12/02/2010  Job:  [id number]

## 2010-12-03 NOTE — Progress Notes (Signed)
Encounter addended by: Doreene Burke on: 12/03/2010  4:21 PM<BR>     Documentation filed: Charges VN

## 2010-12-05 ENCOUNTER — Encounter (HOSPITAL_COMMUNITY): Payer: Self-pay | Admitting: Obstetrics and Gynecology

## 2011-10-11 ENCOUNTER — Other Ambulatory Visit: Payer: Self-pay | Admitting: Obstetrics and Gynecology

## 2011-10-11 DIAGNOSIS — N63 Unspecified lump in unspecified breast: Secondary | ICD-10-CM

## 2011-11-03 ENCOUNTER — Ambulatory Visit
Admission: RE | Admit: 2011-11-03 | Discharge: 2011-11-03 | Disposition: A | Discharge status: Home / Self Care | Payer: Medicare Other | Source: Ambulatory Visit | Attending: Obstetrics and Gynecology | Admitting: Obstetrics and Gynecology

## 2011-11-03 DIAGNOSIS — N63 Unspecified lump in unspecified breast: Secondary | ICD-10-CM

## 2011-11-12 ENCOUNTER — Encounter: Payer: Self-pay | Admitting: Obstetrics and Gynecology

## 2012-01-09 DIAGNOSIS — Z8639 Personal history of other endocrine, nutritional and metabolic disease: Secondary | ICD-10-CM | POA: Insufficient documentation

## 2012-01-09 DIAGNOSIS — N83209 Unspecified ovarian cyst, unspecified side: Secondary | ICD-10-CM | POA: Insufficient documentation

## 2012-01-09 DIAGNOSIS — I10 Essential (primary) hypertension: Secondary | ICD-10-CM | POA: Insufficient documentation

## 2012-01-09 DIAGNOSIS — N841 Polyp of cervix uteri: Secondary | ICD-10-CM | POA: Insufficient documentation

## 2012-01-09 DIAGNOSIS — D649 Anemia, unspecified: Secondary | ICD-10-CM | POA: Insufficient documentation

## 2012-01-09 DIAGNOSIS — F419 Anxiety disorder, unspecified: Secondary | ICD-10-CM | POA: Insufficient documentation

## 2012-01-09 DIAGNOSIS — G4700 Insomnia, unspecified: Secondary | ICD-10-CM | POA: Insufficient documentation

## 2012-01-09 DIAGNOSIS — M1990 Unspecified osteoarthritis, unspecified site: Secondary | ICD-10-CM | POA: Insufficient documentation

## 2012-01-09 DIAGNOSIS — R160 Hepatomegaly, not elsewhere classified: Secondary | ICD-10-CM | POA: Insufficient documentation

## 2012-01-15 ENCOUNTER — Ambulatory Visit: Payer: Medicare Other | Admitting: Obstetrics and Gynecology

## 2012-02-05 ENCOUNTER — Other Ambulatory Visit: Payer: Self-pay | Admitting: Obstetrics and Gynecology

## 2012-02-05 ENCOUNTER — Encounter: Payer: Self-pay | Admitting: Obstetrics and Gynecology

## 2012-02-05 ENCOUNTER — Ambulatory Visit (INDEPENDENT_AMBULATORY_CARE_PROVIDER_SITE_OTHER): Payer: Medicare Other | Admitting: Obstetrics and Gynecology

## 2012-02-05 ENCOUNTER — Ambulatory Visit (INDEPENDENT_AMBULATORY_CARE_PROVIDER_SITE_OTHER): Payer: Medicare Other

## 2012-02-05 VITALS — BP 122/78 | Resp 16 | Ht 60.0 in

## 2012-02-05 DIAGNOSIS — Z719 Counseling, unspecified: Secondary | ICD-10-CM

## 2012-02-05 DIAGNOSIS — D219 Benign neoplasm of connective and other soft tissue, unspecified: Secondary | ICD-10-CM

## 2012-02-05 DIAGNOSIS — N949 Unspecified condition associated with female genital organs and menstrual cycle: Secondary | ICD-10-CM

## 2012-02-05 DIAGNOSIS — R922 Inconclusive mammogram: Secondary | ICD-10-CM

## 2012-02-05 DIAGNOSIS — D259 Leiomyoma of uterus, unspecified: Secondary | ICD-10-CM

## 2012-02-05 DIAGNOSIS — R102 Pelvic and perineal pain: Secondary | ICD-10-CM

## 2012-02-05 DIAGNOSIS — N83202 Unspecified ovarian cyst, left side: Secondary | ICD-10-CM

## 2012-02-05 DIAGNOSIS — N899 Noninflammatory disorder of vagina, unspecified: Secondary | ICD-10-CM

## 2012-02-05 DIAGNOSIS — N83209 Unspecified ovarian cyst, unspecified side: Secondary | ICD-10-CM

## 2012-02-05 DIAGNOSIS — N841 Polyp of cervix uteri: Secondary | ICD-10-CM

## 2012-02-05 DIAGNOSIS — N898 Other specified noninflammatory disorders of vagina: Secondary | ICD-10-CM

## 2012-02-05 LAB — POCT WET PREP (WET MOUNT): Clue Cells Wet Prep Whiff POC: NEGATIVE

## 2012-02-05 NOTE — Progress Notes (Signed)
Patient ID: [name], female   DOB: December 02, 1970, 41 y.o.   MRN: [id number] Last Pap: 09/2010 WNL: No Regular Periods:no Contraception: hyst  Monthly Breast exam:yes Tetanus<69yrs:yes Nl.Bladder Function:yes Daily BMs:yes Healthy Diet:yes Calcium:no Mammogram:yes Date of Mammogram: 2013 wnl Exercise:no Have often Exercise: Not now Seatbelt: yes Abuse at home: no Stressful work:no Sigmoid-colonoscopy: never Bone Density: No PCP: Dr. Glenis Smoker Change in PMH: Increased trouble w/ acid reflux Change in FMH:no Subjective:    [name] is a 41 y.o. female G2P0011 who presents for evaluation of several complaints.  She has used new perineal cosmetics and has noted some external irritation.  Pt also notes occassional and self limited RLQ pain unassociated with nausea or vomiting. She complains of increased epigastric pain after eating  The following portions of the patient's history were reviewed and updated as appropriate: allergies, current medications, past family history, past medical history, past social history, past surgical history and problem list.  Review of Systems Pertinent items are noted in HPI. Gastrointestinal:No change in bowel habits, no abdominal pain, no rectal bleeding Genitourinary:negative for dysuria, frequency, hematuria, nocturia and urinary incontinence    Objective:     BP 122/78  Resp 16  Ht 5' (1.524 m)  LMP 11/03/2010  Weight:  Wt Readings from Last 1 Encounters:  11/29/10 125 lb (56.7 kg)     BMI: There is no weight on file to calculate BMI. General Appearance: Alert, appropriate appearance for age. No acute distress HEENT: Grossly normal Neck / Thyroid: Supple, no masses, nodes or enlargement Lungs: clear to auscultation bilaterally Back: No CVA tenderness Breast Exam: No masses or nodes.No dimpling, nipple retraction or discharge. Cardiovascular: Regular rate and rhythm. S1, S2, no murmur Gastrointestinal: Soft, non-tender, no masses  or organomegaly Pelvic Exam: External genitalia: normal general appearance Vaginal: normal rugae and vaginal vault, well healed and suspended Cervix: removed surgically Adnexa: No masses or tenderness Uterus: removed surgically Rectovaginal: normal rectal, no masses Lymphatic Exam: Non-palpable nodes in neck, clavicular, axillary, or inguinal regions Skin: no rash or abnormalities Neurologic: Normal gait and speech, no tremor  Psychiatric: Alert and oriented, appropriate affect.   ULTRASOUND: Surgically absent uterus, normal vaginal cuff, normal right ovary, left ovarian simple septated cyst measuring 4.85 in largestr dimension.  Left hydrosalpinx   Urinalysis:Not done Wet Prep:  neg   Assessment:    RLQ pain  Asymptomatic left ovarian cyst and hydrosalpinx. Chemical vulvar irritation   Plan:  GI referral for evaluation of epigastric and RLQ pain Discontinue perineal cosmetic use mammogram followup in 3 months to repeat U/S and followup left ovarian cyst   Dierdre Forth, M.D.

## 2012-02-07 ENCOUNTER — Telehealth: Payer: Self-pay

## 2012-02-07 NOTE — Telephone Encounter (Signed)
Pc to Dr. Kenna Gilbert office. Appt sched 02/15/12 @ 10:45. Pt aware. Records faxed [phone].

## 2012-02-07 NOTE — Telephone Encounter (Signed)
Message copied by Raylene Everts on Wed Feb 07, 2012 10:46 AM ------      Message from: Dierdre Forth P      Created: Mon Feb 05, 2012 11:22 AM      Regarding: referral to Dr. Loreta Ave       Pt seen by her previously.  Having increased reflux symptoms

## 2012-05-06 DIAGNOSIS — N200 Calculus of kidney: Secondary | ICD-10-CM | POA: Insufficient documentation

## 2012-05-13 ENCOUNTER — Ambulatory Visit: Payer: Medicare Other | Admitting: Obstetrics and Gynecology

## 2012-05-13 ENCOUNTER — Other Ambulatory Visit: Payer: Self-pay

## 2012-05-13 ENCOUNTER — Encounter: Payer: Self-pay | Admitting: Obstetrics and Gynecology

## 2012-05-13 ENCOUNTER — Ambulatory Visit: Payer: Medicare Other

## 2012-05-13 VITALS — BP 136/100 | Wt 119.0 lb

## 2012-05-13 DIAGNOSIS — N200 Calculus of kidney: Secondary | ICD-10-CM

## 2012-05-13 DIAGNOSIS — R102 Pelvic and perineal pain: Secondary | ICD-10-CM

## 2012-05-13 DIAGNOSIS — N83209 Unspecified ovarian cyst, unspecified side: Secondary | ICD-10-CM

## 2012-05-13 DIAGNOSIS — R1909 Other intra-abdominal and pelvic swelling, mass and lump: Secondary | ICD-10-CM

## 2012-05-13 MED ORDER — FLUCONAZOLE 150 MG PO TABS: 150.0000 mg | ORAL_TABLET | Freq: Once | ORAL | Status: DC

## 2012-05-13 NOTE — Progress Notes (Signed)
GYN PROBLEM VISIT Pt here for cyst f/u, also thinks she has a yeast infection due to taking antibiotic.  [name] is a 42 y.o. year old female,G2P0011, who presents for a problem visit.   Subjective: Continues with abdominopelvic pain and dyspareunia  Objective:  BP 136/100  Wt 119 lb (53.978 kg)  BMI 23.24 kg/m2  LMP 11/03/2010  ULTRASOUND: Uterus: surgically absent     Endo thickness:  Surgically absent   Left ovary:not well visualized Right ovary:Abnormal - septated cyst- Measures 3.9cm x 2.7 cm x 3.9cm  CDS fluid:yes (small amount)-wnl's Comment: Left tubular structure c/w hydrosaphinx. Measures: 5.6cm x 3.3cm x 3.6cm  Assessment: Persistent Right ovarian cyst and Left hydrosalpinx Pelvic pain and dyspareunia  Plan: Ca125 Laparoscopic oophorectomy and salpingectomy recommended and accepted.  Will schedule on a Friday to accomodate school schedule RTO tba   Dierdre Forth, MD  05/13/2012 2:35 PM

## 2012-05-30 ENCOUNTER — Other Ambulatory Visit: Payer: Self-pay | Admitting: Obstetrics and Gynecology

## 2012-06-03 ENCOUNTER — Encounter (HOSPITAL_COMMUNITY): Payer: Self-pay | Admitting: Pharmacy Technician

## 2012-06-05 ENCOUNTER — Other Ambulatory Visit: Payer: Self-pay

## 2012-06-05 ENCOUNTER — Encounter (HOSPITAL_COMMUNITY): Payer: Self-pay

## 2012-06-05 ENCOUNTER — Encounter (HOSPITAL_COMMUNITY)
Admission: RE | Admit: 2012-06-05 | Discharge: 2012-06-05 | Disposition: A | Discharge status: Home / Self Care | Payer: Medicare Other | Source: Ambulatory Visit | Attending: Obstetrics and Gynecology | Admitting: Obstetrics and Gynecology

## 2012-06-05 ENCOUNTER — Other Ambulatory Visit: Payer: Self-pay | Admitting: Obstetrics and Gynecology

## 2012-06-05 HISTORY — DX: Major depressive disorder, single episode, unspecified: F32.9

## 2012-06-05 HISTORY — DX: Depression, unspecified: F32.A

## 2012-06-05 LAB — CBC
Hemoglobin: 12.2 g/dL (ref 12.0–15.0)
MCH: 30.1 pg (ref 26.0–34.0)
RBC: 4.05 MIL/uL (ref 3.87–5.11)

## 2012-06-05 LAB — SURGICAL PCR SCREEN
MRSA, PCR: NEGATIVE
Staphylococcus aureus: NEGATIVE

## 2012-06-05 LAB — BASIC METABOLIC PANEL
Chloride: 100 mEq/L (ref 96–112)
GFR calc Af Amer: >90 mL/min (ref >90)
Potassium: 3.7 mEq/L (ref 3.5–5.1)

## 2012-06-05 NOTE — Patient Instructions (Signed)
Your procedure is scheduled on:06/12/12  Enter through the Main Entrance at :1130am Pick up desk phone and dial [phone] and inform us of your arrival.  Please call [phone] if you have any problems the morning of surgery.  Remember: Do not eat after midnight:TUESDAY Clear liquids ok until 9am on WED  Take these meds the morning of surgery with a sip of water:blood pressure pill  DO NOT wear jewelry, eye make-up, lipstick,body lotion, or dark fingernail polish.   If you are to be admitted after surgery, leave suitcase in car until your room has been assigned. Patients discharged on the day of surgery will not be allowed to drive home.

## 2012-06-12 ENCOUNTER — Encounter (HOSPITAL_COMMUNITY): Payer: Self-pay | Admitting: Anesthesiology

## 2012-06-12 ENCOUNTER — Ambulatory Visit (HOSPITAL_COMMUNITY)
Admission: RE | Admit: 2012-06-12 | Discharge: 2012-06-12 | Disposition: A | Discharge status: Home / Self Care | Payer: Medicare Other | Source: Ambulatory Visit | Attending: Obstetrics and Gynecology | Admitting: Obstetrics and Gynecology

## 2012-06-12 ENCOUNTER — Ambulatory Visit (HOSPITAL_COMMUNITY): Payer: Medicare Other | Admitting: Anesthesiology

## 2012-06-12 ENCOUNTER — Encounter (HOSPITAL_COMMUNITY)
Admission: RE | Disposition: A | Discharge status: Home / Self Care | Payer: Self-pay | Source: Ambulatory Visit | Attending: Obstetrics and Gynecology

## 2012-06-12 DIAGNOSIS — N7013 Chronic salpingitis and oophoritis: Secondary | ICD-10-CM | POA: Insufficient documentation

## 2012-06-12 DIAGNOSIS — R1031 Right lower quadrant pain: Secondary | ICD-10-CM | POA: Insufficient documentation

## 2012-06-12 DIAGNOSIS — N949 Unspecified condition associated with female genital organs and menstrual cycle: Secondary | ICD-10-CM | POA: Insufficient documentation

## 2012-06-12 DIAGNOSIS — I10 Essential (primary) hypertension: Secondary | ICD-10-CM | POA: Insufficient documentation

## 2012-06-12 DIAGNOSIS — Z90710 Acquired absence of both cervix and uterus: Secondary | ICD-10-CM | POA: Insufficient documentation

## 2012-06-12 DIAGNOSIS — K219 Gastro-esophageal reflux disease without esophagitis: Secondary | ICD-10-CM | POA: Insufficient documentation

## 2012-06-12 DIAGNOSIS — N83209 Unspecified ovarian cyst, unspecified side: Secondary | ICD-10-CM | POA: Insufficient documentation

## 2012-06-12 HISTORY — PX: LAPAROSCOPY: SHX197

## 2012-06-12 HISTORY — PX: BILATERAL SALPINGECTOMY: SHX5743

## 2012-06-12 LAB — PREGNANCY, URINE: Preg Test, Ur: NEGATIVE

## 2012-06-12 SURGERY — LAPAROSCOPY OPERATIVE
Anesthesia: General | Site: Abdomen | Laterality: Right | Wound class: Clean Contaminated

## 2012-06-12 MED ORDER — HYDROMORPHONE HCL PF 1 MG/ML IJ SOLN: 0.2500 mg | INTRAMUSCULAR | Status: DC | PRN

## 2012-06-12 MED ORDER — DEXAMETHASONE SODIUM PHOSPHATE 4 MG/ML IJ SOLN
INTRAMUSCULAR | Status: DC | PRN
  Administered 2012-06-12: 10 mg via INTRAVENOUS

## 2012-06-12 MED ORDER — HYDROCODONE-ACETAMINOPHEN 5-325 MG PO TABS
ORAL_TABLET | ORAL | Status: AC
  Filled 2012-06-12: qty 1

## 2012-06-12 MED ORDER — PANTOPRAZOLE SODIUM 40 MG IV SOLR
40.0000 mg | Freq: Once | INTRAVENOUS | Status: DC
  Filled 2012-06-12: qty 40

## 2012-06-12 MED ORDER — KETOROLAC TROMETHAMINE 30 MG/ML IJ SOLN
INTRAMUSCULAR | Status: AC
  Filled 2012-06-12: qty 1

## 2012-06-12 MED ORDER — PROPOFOL 10 MG/ML IV EMUL
INTRAVENOUS | Status: AC
  Filled 2012-06-12: qty 20

## 2012-06-12 MED ORDER — PANTOPRAZOLE SODIUM 20 MG PO TBEC: 20.0000 mg | DELAYED_RELEASE_TABLET | Freq: Every day | ORAL | Status: DC

## 2012-06-12 MED ORDER — ONDANSETRON HCL 4 MG/2ML IJ SOLN
INTRAMUSCULAR | Status: AC
  Filled 2012-06-12: qty 2

## 2012-06-12 MED ORDER — LIDOCAINE HCL (CARDIAC) 20 MG/ML IV SOLN
INTRAVENOUS | Status: AC
  Filled 2012-06-12: qty 5

## 2012-06-12 MED ORDER — SCOPOLAMINE 1 MG/3DAYS TD PT72: 1.0000 | MEDICATED_PATCH | TRANSDERMAL | Status: DC

## 2012-06-12 MED ORDER — HYDROCODONE-ACETAMINOPHEN 5-300 MG PO TABS: ORAL_TABLET | ORAL | Status: DC

## 2012-06-12 MED ORDER — IBUPROFEN 600 MG PO TABS: ORAL_TABLET | ORAL | Status: AC

## 2012-06-12 MED ORDER — FENTANYL CITRATE 0.05 MG/ML IJ SOLN
INTRAMUSCULAR | Status: AC
  Filled 2012-06-12: qty 5

## 2012-06-12 MED ORDER — NEOSTIGMINE METHYLSULFATE 1 MG/ML IJ SOLN
INTRAMUSCULAR | Status: DC | PRN
  Administered 2012-06-12: 3 mg via INTRAVENOUS

## 2012-06-12 MED ORDER — 0.9 % SODIUM CHLORIDE (POUR BTL) OPTIME: TOPICAL | Status: DC | PRN

## 2012-06-12 MED ORDER — BUPIVACAINE HCL (PF) 0.25 % IJ SOLN
INTRAMUSCULAR | Status: DC | PRN
  Administered 2012-06-12: 20 mL
  Administered 2012-06-12: 10 mL

## 2012-06-12 MED ORDER — NEOSTIGMINE METHYLSULFATE 1 MG/ML IJ SOLN
INTRAMUSCULAR | Status: AC
  Filled 2012-06-12: qty 1

## 2012-06-12 MED ORDER — FENTANYL CITRATE 0.05 MG/ML IJ SOLN
INTRAMUSCULAR | Status: AC
  Filled 2012-06-12: qty 2

## 2012-06-12 MED ORDER — KETOROLAC TROMETHAMINE 15 MG/ML IJ SOLN
INTRAMUSCULAR | Status: DC | PRN
  Administered 2012-06-12: 30 mg via INTRAMUSCULAR

## 2012-06-12 MED ORDER — MIDAZOLAM HCL 5 MG/5ML IJ SOLN
INTRAMUSCULAR | Status: DC | PRN
  Administered 2012-06-12 (×2): 1 mg via INTRAVENOUS

## 2012-06-12 MED ORDER — DEXAMETHASONE SODIUM PHOSPHATE 10 MG/ML IJ SOLN
INTRAMUSCULAR | Status: AC
  Filled 2012-06-12: qty 1

## 2012-06-12 MED ORDER — INDIGOTINDISULFONATE SODIUM 8 MG/ML IJ SOLN
INTRAMUSCULAR | Status: AC
  Filled 2012-06-12: qty 5

## 2012-06-12 MED ORDER — BUPIVACAINE HCL (PF) 0.25 % IJ SOLN
INTRAMUSCULAR | Status: AC
  Filled 2012-06-12: qty 30

## 2012-06-12 MED ORDER — LABETALOL HCL 5 MG/ML IV SOLN
INTRAVENOUS | Status: DC | PRN
  Administered 2012-06-12: 10 mg via INTRAVENOUS
  Administered 2012-06-12: 5 mg via INTRAVENOUS

## 2012-06-12 MED ORDER — METOCLOPRAMIDE HCL 5 MG/ML IJ SOLN: 10.0000 mg | Freq: Once | INTRAMUSCULAR | Status: DC | PRN

## 2012-06-12 MED ORDER — ONDANSETRON HCL 4 MG/2ML IJ SOLN
INTRAMUSCULAR | Status: DC | PRN
  Administered 2012-06-12: 4 mg via INTRAVENOUS

## 2012-06-12 MED ORDER — SCOPOLAMINE 1 MG/3DAYS TD PT72
MEDICATED_PATCH | TRANSDERMAL | Status: AC
  Administered 2012-06-12: 1.5 mg via TRANSDERMAL
  Filled 2012-06-12: qty 1

## 2012-06-12 MED ORDER — HYDROCODONE-ACETAMINOPHEN 5-325 MG PO TABS
1.0000 | ORAL_TABLET | Freq: Once | ORAL | Status: AC
  Administered 2012-06-12: 1 via ORAL

## 2012-06-12 MED ORDER — ROCURONIUM BROMIDE 100 MG/10ML IV SOLN
INTRAVENOUS | Status: DC | PRN
  Administered 2012-06-12: 5 mg via INTRAVENOUS
  Administered 2012-06-12: 10 mg via INTRAVENOUS
  Administered 2012-06-12: 45 mg via INTRAVENOUS
  Administered 2012-06-12: 5 mg via INTRAVENOUS

## 2012-06-12 MED ORDER — MIDAZOLAM HCL 2 MG/2ML IJ SOLN
INTRAMUSCULAR | Status: AC
  Filled 2012-06-12: qty 2

## 2012-06-12 MED ORDER — GLYCOPYRROLATE 0.2 MG/ML IJ SOLN
INTRAMUSCULAR | Status: DC | PRN
  Administered 2012-06-12: 0.4 mg via INTRAVENOUS

## 2012-06-12 MED ORDER — LACTATED RINGERS IV SOLN
INTRAVENOUS | Status: DC
  Administered 2012-06-12 (×2): via INTRAVENOUS

## 2012-06-12 MED ORDER — GLYCOPYRROLATE 0.2 MG/ML IJ SOLN
INTRAMUSCULAR | Status: AC
  Filled 2012-06-12: qty 2

## 2012-06-12 MED ORDER — MEPERIDINE HCL 25 MG/ML IJ SOLN: 6.2500 mg | INTRAMUSCULAR | Status: DC | PRN

## 2012-06-12 MED ORDER — KETOROLAC TROMETHAMINE 30 MG/ML IJ SOLN
INTRAMUSCULAR | Status: DC | PRN
  Administered 2012-06-12: 30 mg via INTRAVENOUS

## 2012-06-12 MED ORDER — FENTANYL CITRATE 0.05 MG/ML IJ SOLN
INTRAMUSCULAR | Status: DC | PRN
  Administered 2012-06-12 (×3): 50 ug via INTRAVENOUS
  Administered 2012-06-12: 100 ug via INTRAVENOUS
  Administered 2012-06-12: 50 ug via INTRAVENOUS
  Administered 2012-06-12: 100 ug via INTRAVENOUS
  Administered 2012-06-12: 50 ug via INTRAVENOUS

## 2012-06-12 MED ORDER — ROCURONIUM BROMIDE 50 MG/5ML IV SOLN
INTRAVENOUS | Status: AC
  Filled 2012-06-12: qty 1

## 2012-06-12 MED ORDER — PROPOFOL 10 MG/ML IV EMUL
INTRAVENOUS | Status: DC | PRN
  Administered 2012-06-12: 180 mg via INTRAVENOUS

## 2012-06-12 MED ORDER — LIDOCAINE HCL (CARDIAC) 20 MG/ML IV SOLN
INTRAVENOUS | Status: DC | PRN
  Administered 2012-06-12: 80 mg via INTRAVENOUS

## 2012-06-12 SURGICAL SUPPLY — 31 items
ADH SKN CLS APL DERMABOND .7 (GAUZE/BANDAGES/DRESSINGS) ×4
BAG SPEC RTRVL LRG 6X4 10 (ENDOMECHANICALS) ×4
CABLE HIGH FREQUENCY MONO STRZ (ELECTRODE) ×2 IMPLANT
CHLORAPREP W/TINT 26ML (MISCELLANEOUS) ×5 IMPLANT
CLOTH BEACON ORANGE TIMEOUT ST (SAFETY) ×5 IMPLANT
CONTAINER PREFILL 10% NBF 60ML (FORM) IMPLANT
DERMABOND ADVANCED (GAUZE/BANDAGES/DRESSINGS) ×1
DERMABOND ADVANCED .7 DNX12 (GAUZE/BANDAGES/DRESSINGS) ×1 IMPLANT
DRESSING TELFA 8X3 (GAUZE/BANDAGES/DRESSINGS) ×2 IMPLANT
GLOVE SURG SS PI 6.5 STRL IVOR (GLOVE) ×10 IMPLANT
GOWN PREVENTION PLUS LG XLONG (DISPOSABLE) ×10 IMPLANT
NS IRRIG 1000ML POUR BTL (IV SOLUTION) ×5 IMPLANT
PACK LAPAROSCOPY BASIN (CUSTOM PROCEDURE TRAY) ×5 IMPLANT
POUCH SPECIMEN RETRIEVAL 10MM (ENDOMECHANICALS) ×2 IMPLANT
PROTECTOR NERVE ULNAR (MISCELLANEOUS) ×5 IMPLANT
SCALPEL HARMONIC ACE (MISCELLANEOUS) IMPLANT
SCISSORS LAP 5X45 EPIX DISP (ENDOMECHANICALS) ×2 IMPLANT
SET IRRIG TUBING LAPAROSCOPIC (IRRIGATION / IRRIGATOR) IMPLANT
STRIP CLOSURE SKIN 1/4X3 (GAUZE/BANDAGES/DRESSINGS) IMPLANT
SUT MNCRL AB 4-0 PS2 18 (SUTURE) IMPLANT
SUT VIC AB 3-0 PS2 18 (SUTURE)
SUT VIC AB 3-0 PS2 18XBRD (SUTURE) IMPLANT
SUT VICRYL 0 ENDOLOOP (SUTURE) ×4 IMPLANT
SUT VICRYL 0 UR6 27IN ABS (SUTURE) ×2 IMPLANT
SYR 30ML LL (SYRINGE) ×2 IMPLANT
TOWEL OR 17X24 6PK STRL BLUE (TOWEL DISPOSABLE) ×10 IMPLANT
TRAY FOLEY CATH 14FR (SET/KITS/TRAYS/PACK) ×5 IMPLANT
TROCAR BALL TOP DISP 5MM (ENDOMECHANICALS) IMPLANT
TROCAR XCEL DIL TIP R 11M (ENDOMECHANICALS) ×2 IMPLANT
WARMER LAPAROSCOPE (MISCELLANEOUS) ×5 IMPLANT
WATER STERILE IRR 1000ML POUR (IV SOLUTION) ×3 IMPLANT

## 2012-06-12 NOTE — Anesthesia Preprocedure Evaluation (Signed)
Anesthesia Evaluation  Patient identified by MRN, date of birth, ID band Patient awake    Reviewed: Allergy & Precautions, H&P , Patient's Chart, lab work & pertinent test results  Airway Mallampati: III TM Distance: >3 FB Neck ROM: Full    Dental no notable dental hx. (+) Teeth Intact   Pulmonary neg pulmonary ROS,  breath sounds clear to auscultation  Pulmonary exam normal       Cardiovascular hypertension, Pt. on medications negative cardio ROS  Rhythm:Regular Rate:Normal     Neuro/Psych PSYCHIATRIC DISORDERS Anxiety Depression negative neurological ROS     GI/Hepatic Neg liver ROS, GERD-  Medicated and Controlled,  Endo/Other  negative endocrine ROS  Renal/GU Renal disease  negative genitourinary   Musculoskeletal negative musculoskeletal ROS (+)   Abdominal   Peds  Hematology negative hematology ROS (+)   Anesthesia Other Findings   Reproductive/Obstetrics Endocervical Polyp Uterine Fibroids                           Anesthesia Physical Anesthesia Plan  ASA: II  Anesthesia Plan: General   Post-op Pain Management:    Induction: Intravenous  Airway Management Planned: Oral ETT  Additional Equipment:   Intra-op Plan:   Post-operative Plan: Extubation in OR  Informed Consent: I have reviewed the patients History and Physical, chart, labs and discussed the procedure including the risks, benefits and alternatives for the proposed anesthesia with the patient or authorized representative who has indicated his/her understanding and acceptance.   Dental advisory given  Plan Discussed with: CRNA, Anesthesiologist and Surgeon  Anesthesia Plan Comments:         Anesthesia Quick Evaluation

## 2012-06-12 NOTE — Anesthesia Postprocedure Evaluation (Signed)
  Anesthesia Post-op Note  Patient: [name]  Procedure(s) Performed: Procedure(s): LAPAROSCOPY OPERATIVE (N/A) BILATERAL SALPINGECTOMY (Bilateral) OOPHORECTOMY (Right)  Patient Location: PACU  Anesthesia Type:General  Level of Consciousness: awake, alert  and oriented  Airway and Oxygen Therapy: Patient Spontanous Breathing  Post-op Pain: mild  Post-op Assessment: Post-op Vital signs reviewed, Patient's Cardiovascular Status Stable, Respiratory Function Stable, Patent Airway, No signs of Nausea or vomiting and Pain level controlled  Post-op Vital Signs: Reviewed and stable  Complications: No apparent anesthesia complications

## 2012-06-12 NOTE — Transfer of Care (Signed)
Immediate Anesthesia Transfer of Care Note  Patient: [name]  Procedure(s) Performed: Procedure(s): LAPAROSCOPY OPERATIVE (N/A) BILATERAL SALPINGECTOMY (Bilateral) OOPHORECTOMY (Right)  Patient Location: PACU  Anesthesia Type:General  Level of Consciousness: awake, alert  and oriented  Airway & Oxygen Therapy: Patient Spontanous Breathing and Patient connected to nasal cannula oxygen  Post-op Assessment: Report given to PACU RN and Post -op Vital signs reviewed and stable  Post vital signs: Reviewed and stable  Complications: No apparent anesthesia complications

## 2012-06-12 NOTE — Op Note (Signed)
Operative laparoscopy, right salpingo-oophorectomy, left partial salpingectomy, lysis of adhesions  Procedure Note  Indications: The patient is a 42 y.o. female with a persistent right ovarian cyst and left hydrosalpinx with right lower quadrant pain.  Pre-operative Diagnosis: Right ovarian cyst and left hydrosalpinx     Status post vaginal hysterectomy Post-operative Diagnosis: Right ovarian cyst     Left tubo-ovarian complex with left hydrosalpinx     Significant pelvic adhesions  Surgeon: Dierdre Forth P   Assistants: Henreitta Leber certified physician assistant  Anesthesia: General endotracheal anesthesia  ASA Class: 2  Procedure Details  The patient was seen in the Holding Room. The risks, benefits, complications, treatment options, and expected outcomes were discussed with the patient. The possibilities of reaction to medication, pulmonary aspiration, perforation of viscus, bleeding, recurrent infection, the need for additional procedures, failure to diagnose a condition, and creating a complication requiring transfusion or operation were discussed with the patient. The patient concurred with the proposed plan, giving informed consent. The patient was taken to the Operating Room, identified as [name] and the procedure verified as operative Laparoscopy. A Time Out was held and the above information confirmed.  After induction of general anesthesia, the patient was placed in modified dorsal lithotomy position where she was prepped, draped, and catheterized in the normal, sterile fashion.  A sponge stick was placed in the vagina for elevation of the vaginal cuff. The subumbilical and suprapubic areas were infiltrated with a total of 10 cc of quarter %Marcaine A 1 cm umbilical incision was then performed. Veress needle was passed and pneumoperitoneum was established. The above findings were noted and documented. Suprapubic incisions to the right of midline were made and  laparoscopic probe trocar was placed A 10 mm trocar was placed in the left suprapubic region  and a 5 through the right Suprapubic region.  The right tube and ovary were elevated and extensive lysis of adhesions allowed them to be freed up from the pelvic sidewall. The right ureter was identified and followed along its course and found to be away from the operative field. Once the lysis of adhesions, and allow the tube and ovary to be freed up, the infundibulopelvic ligament was identified and suture ligated with 2  Endoloop sutures of 0 Vicryl.   The tube and ovary were sharply excised and placed in the cul-de-sac for future retrieval.  The left tube and ovary were markedly adherent to the left pelvic sidewall and to the overlying bowel. The overlying bowel and echo were meticulously dissected off the underlying hydrosalpinx. The ovary could not initially be definitively identified. The left ureter was identified. Once, lysis of adhesions allowed lifting of the tubo-ovarian complex only from the pelvic sidewall. Once the ureter was identified. Further, lysis of adhesions allowed the entire complex to be freed up, but never allow the ovary to be freely identified.. The hydrosalpinx began to the leak its clear. Fluid contents and became smaller. Further dissection allowed identification ovarian tissue as well as identification but not free of the infundibulopelvic ligament. At that point the fallopian tube was cauterized across its dilated tubular portion and the isthmic portion and fimbriated in excised. Hemostasis was noted to be adequate and a portion of ovary was clearly left behind. Copious irrigation was carried out.  Hemostasis was noted to be adequate.   An Endo Catch bag was placed through the 10 mm left suprapubic port and the right adnexa and portion of left fallopian tube placed into the bag. The bag  contents were then removed from the peritoneal cavity through the left suprapubic incision.  All  instruments were removed from the peritoneal cavity. Under direct visualization as the CO2 was allowed to escape. The 2 ports, which had held. The 10 mm trochars were closed with fascial sutures of 0 Vicryl in a figure-of-eight fashion. The skin incisions on all 3 ports were reapproximated with Dermabond.  The Foley catheter was removed and the patient awakened from general anesthesia and taken to the recovery room in satisfactory condition having tolerated the procedure well  Instrument, sponge, and needle counts were correct prior to abdominal closure and at the conclusion of the case.   Findings: The anterior cul-de-sac and round ligaments : Status post vaginal hysterectomy.   The uterus  surgically absent  The adnexa  markedly adherent to the pelvic sidewalls on the right and left side. The right were removed clearly multicystic.  The left fallopian tube was markedly enlarged to a hydrosalpinx and likewise adherent to the left ovary  Cul-de-sac  postoperative adhesions, and no stigmata of endometriosis   Estimated Blood Loss:  less than 100 mL         Drains: None         Total IV Fluids: 2000 mL         Specimens: Right fallopian tube and ovary   Portion of left fallopian tube              Complications:  None; patient tolerated the procedure well.         Disposition: PACU - hemodynamically stable.         Condition: stable   Plan of care: Discharge home

## 2012-06-12 NOTE — H&P (Signed)
[name] is an 42 y.o. female who presents for right oophorectomy and salpingectomy for persistent right ovarian cyst and left hydrosalpinx associated with abdominal pelvic pain    Patient's last menstrual period was 11/03/2010 prior to LAVH    Past Medical History  Diagnosis Date  . GERD (gastroesophageal reflux disease)     on nexium-instructed to take dos  . Anemia   . Anxiety   . Arthritis     lower back  . Hypertension     controlled on hctz-b/p 142/93 at pat visit-will do ekg at pat visit  . Fibroadenoma of breast     left breast  . Liver mass 12/2007    benign- per pt " no longer there"  . Ovarian cyst     h/o  . Fibroids     h/o  . Endocervical polyp     h/o  . Insomnia   . H/O hypercholesterolemia   . Depression     Past Surgical History  Procedure Laterality Date  . Ectopic pregnancy surgery  1992  . Myomectomy  2006  . Laparoscopic assisted vaginal hysterectomy  11/29/2010    Procedure: LAPAROSCOPIC ASSISTED VAGINAL HYSTERECTOMY;  Surgeon: Hal Morales, MD;  Location: [facility];  Service: Gynecology;  Laterality: N/A;  . Colon surgery    . Abdominal hysterectomy  11/29/2010    Family History  Problem Relation Age of Onset  . Pancreatic cancer Maternal Grandmother     Social History:  reports that she has never smoked. She has never used smokeless tobacco. She reports that  drinks alcohol. She reports that she does not use illicit drugs.  Allergies:  Allergies  Allergen Reactions  . Oxycodone-Acetaminophen Other (See Comments)    Causes hallucinations.  . Shellfish Allergy     Breaks out in rash  . Sulfa Antibiotics Hives  . Tetracyclines & Related Hives    Prescriptions prior to admission  Medication Sig Dispense Refill  . ARIPiprazole (ABILIFY) 15 MG tablet Take 15 mg by mouth daily.      . Digestive Enzymes (PAPAYA ENZYME PO) Take 2 tablets by mouth daily.      . hydrochlorothiazide (HYDRODIURIL) 25 MG tablet Take 25 mg by mouth  daily.       . mirtazapine (REMERON) 30 MG tablet Take 30 mg by mouth at bedtime.        Review of Systems  Constitutional: Positive for weight loss.       Associated with medication for GERD.  Wt has stabilized since discontinuing med in Dec 2013  HENT: Negative.   Eyes: Negative.   Respiratory: Positive for cough.        Occassional associated with GERD  Cardiovascular: Negative.   Gastrointestinal: Positive for heartburn and abdominal pain.       GERD improved with papaya extract.  Did not tolerate Prilosec or Nexium.  Discontinued in Dec 2013  Genitourinary: Negative.   Musculoskeletal: Negative.   Skin: Negative.   Neurological: Negative.   Endo/Heme/Allergies: Negative.   Psychiatric/Behavioral: Negative.     Blood pressure 145/89, pulse 83, temperature 97.5 F (36.4 C), temperature source Oral, resp. rate 18, last menstrual period 11/03/2010, SpO2 99.00%. Physical Exam  Constitutional: She is oriented to person, place, and time. She appears well-developed and well-nourished.  HENT:  Head: Normocephalic.  Eyes: Conjunctivae and EOM are normal. Pupils are equal, round, and reactive to light.  Neck: Normal range of motion. Neck supple.  Cardiovascular: Normal rate and regular rhythm.  Respiratory: Effort normal and breath sounds normal.  GI: Soft. Bowel sounds are normal.  Musculoskeletal: Normal range of motion.  Neurological: She is alert and oriented to person, place, and time. She has normal reflexes.  Skin: Skin is warm and dry.  Psychiatric: She has a normal mood and affect.  Gastrointestinal: Soft, non-tender, no masses or organomegaly  Pelvic Exam: External genitalia: normal general appearance  Vaginal: normal rugae and vaginal vault, well healed and suspended  Cervix: removed surgically  Adnexa: No masses or tenderness  Uterus: removed surgically  Rectovaginal: normal rectal, no masses   Results for orders placed during the hospital encounter of 06/12/12  (from the past 24 hour(s))  PREGNANCY, URINE     Status: None   Collection Time    06/12/12 11:30 AM      Result Value Range   Preg Test, Ur NEGATIVE  NEGATIVE  ULTRASOUND: done 05/13/12 Uterus: surgically absent  Endo thickness: Surgically absent  Left ovary:not well visualized  Right ovary:Abnormal - septated cyst- Measures 3.9cm x 2.7 cm x 3.9cm  CDS fluid:yes (small amount)-wnl's  Comment: Left tubular structure c/w hydrosaphinx. Measures: 5.6cm x 3.3cm x 3.6cm     Assessment/Plan: Persistent symptomatic right ovarian lesion with nl Ca125.  Pt requests oophorectomy and consents to bilateral salpingectomy as well.  Risks and benefits of procedure have been reviewed as well as the possible need for laparotomy for removal of ovary and she wishes to proceed.  Lakitha Gordy P 06/12/2012, 12:50 PM

## 2012-06-13 ENCOUNTER — Encounter (HOSPITAL_COMMUNITY): Payer: Self-pay | Admitting: Obstetrics and Gynecology

## 2012-12-24 ENCOUNTER — Other Ambulatory Visit: Payer: Self-pay

## 2012-12-24 DIAGNOSIS — Z1231 Encounter for screening mammogram for malignant neoplasm of breast: Secondary | ICD-10-CM

## 2013-01-24 ENCOUNTER — Ambulatory Visit
Admission: RE | Admit: 2013-01-24 | Discharge: 2013-01-24 | Disposition: A | Discharge status: Home / Self Care | Payer: Medicare Other | Source: Ambulatory Visit

## 2013-01-24 DIAGNOSIS — Z1231 Encounter for screening mammogram for malignant neoplasm of breast: Secondary | ICD-10-CM

## 2013-08-07 IMAGING — MG MM DIGITAL DIAGNOSTIC BILAT
4 series · 4 of 4 positions shown · non-contrast
Comparison: Previous examinations.

CLINICAL DATA: Follow-up left breast probable fibroadenoma.

DIGITAL DIAGNOSTIC BILATERAL MAMMOGRAM WITH CAD AND LEFT BREAST
ULTRASOUND:

[R CC]
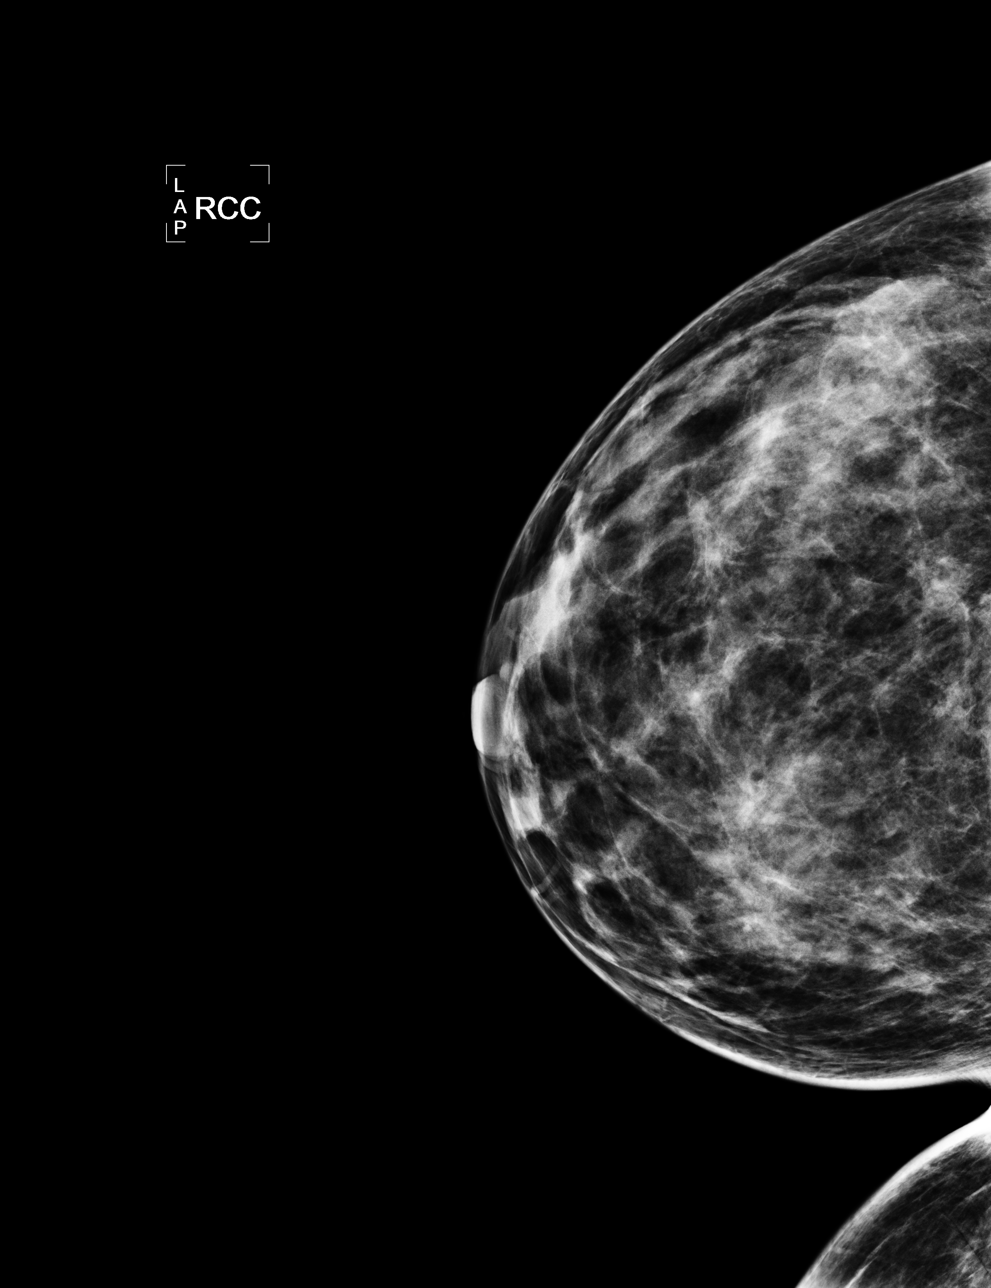

[L CC]
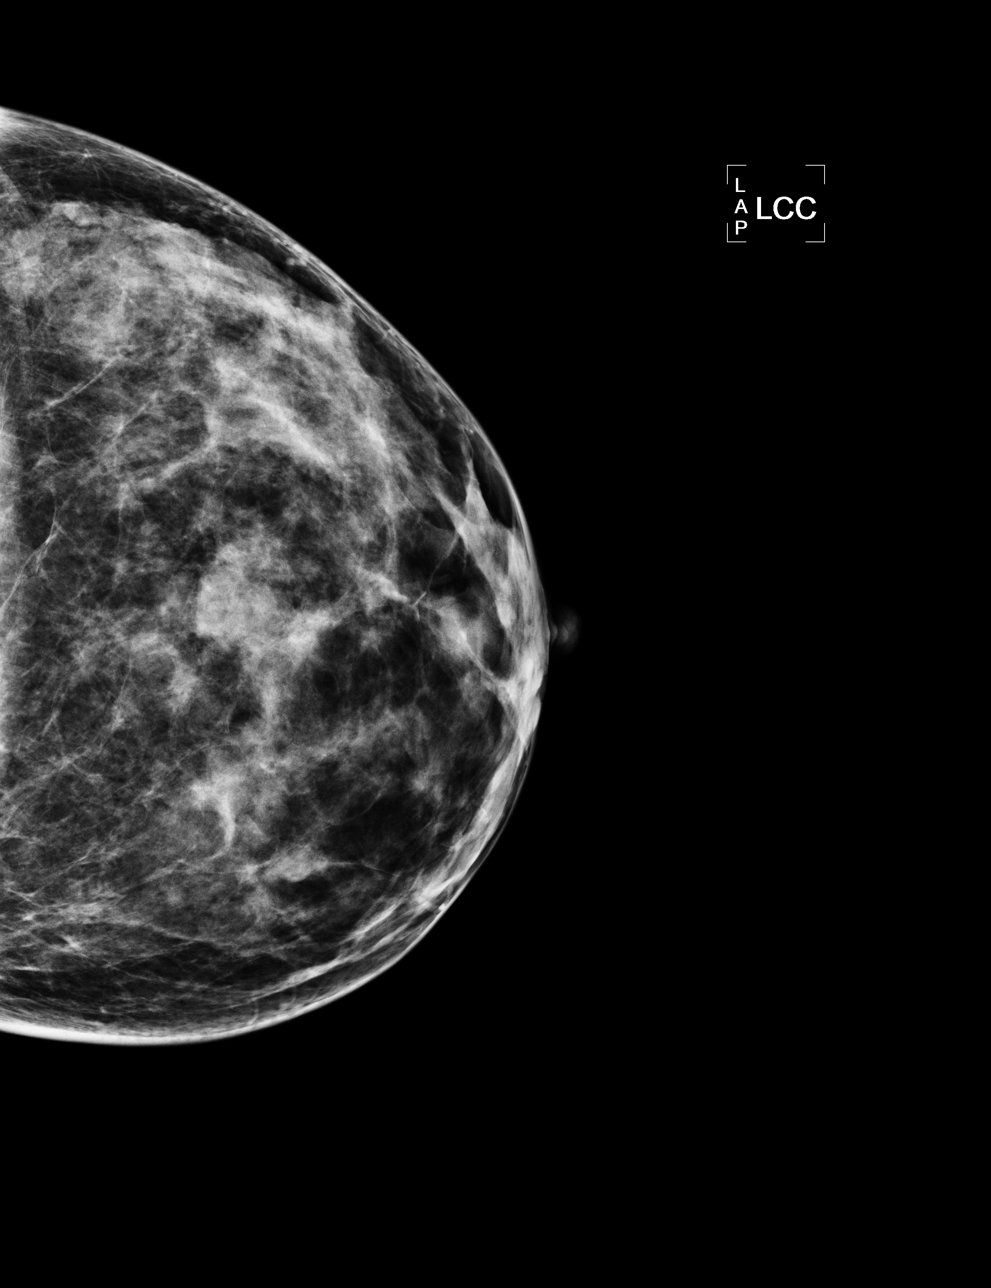

[L MLO]
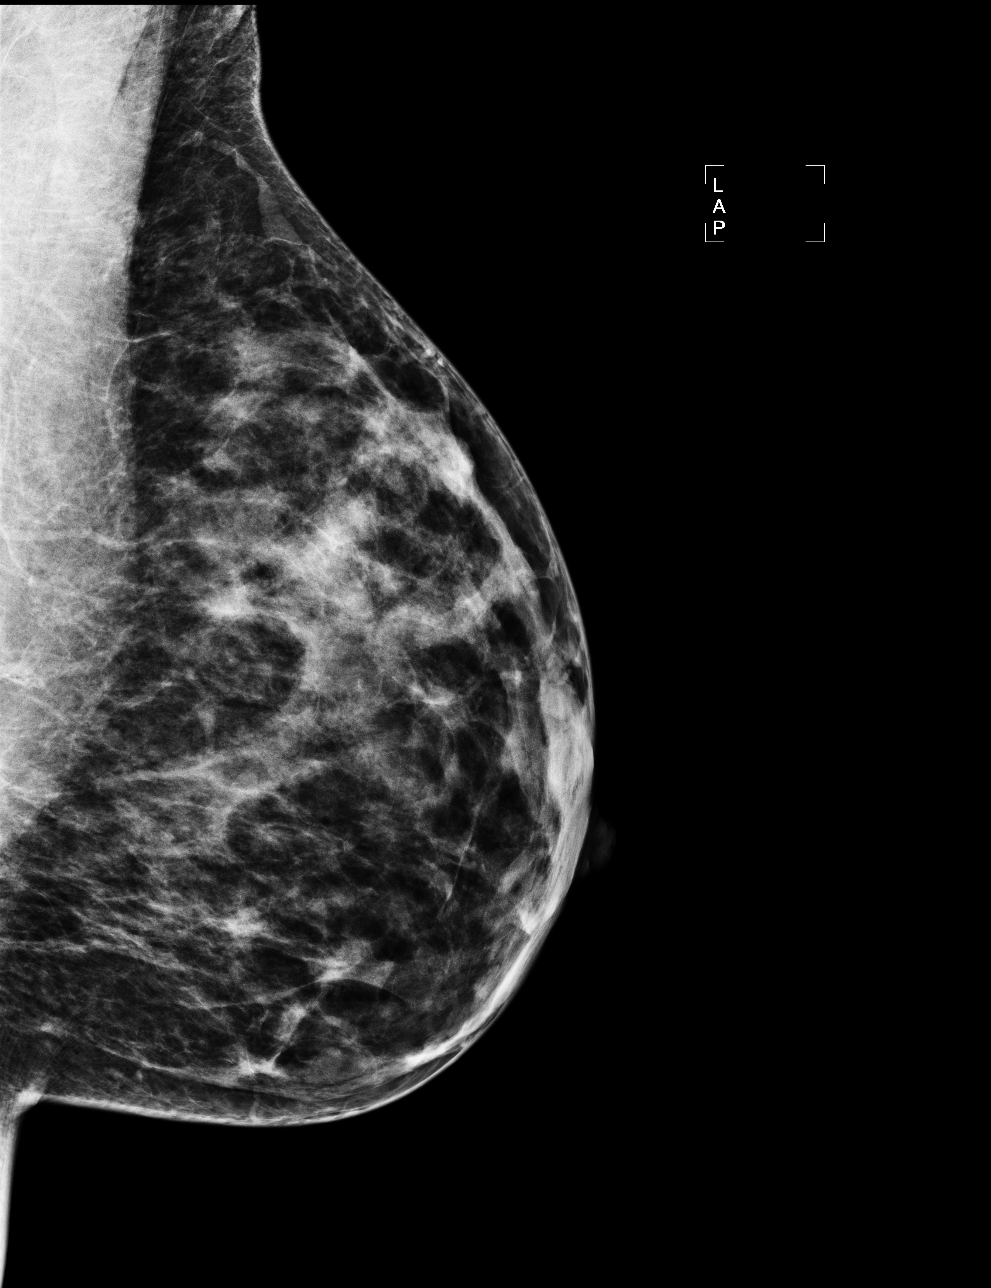

[R MLO]
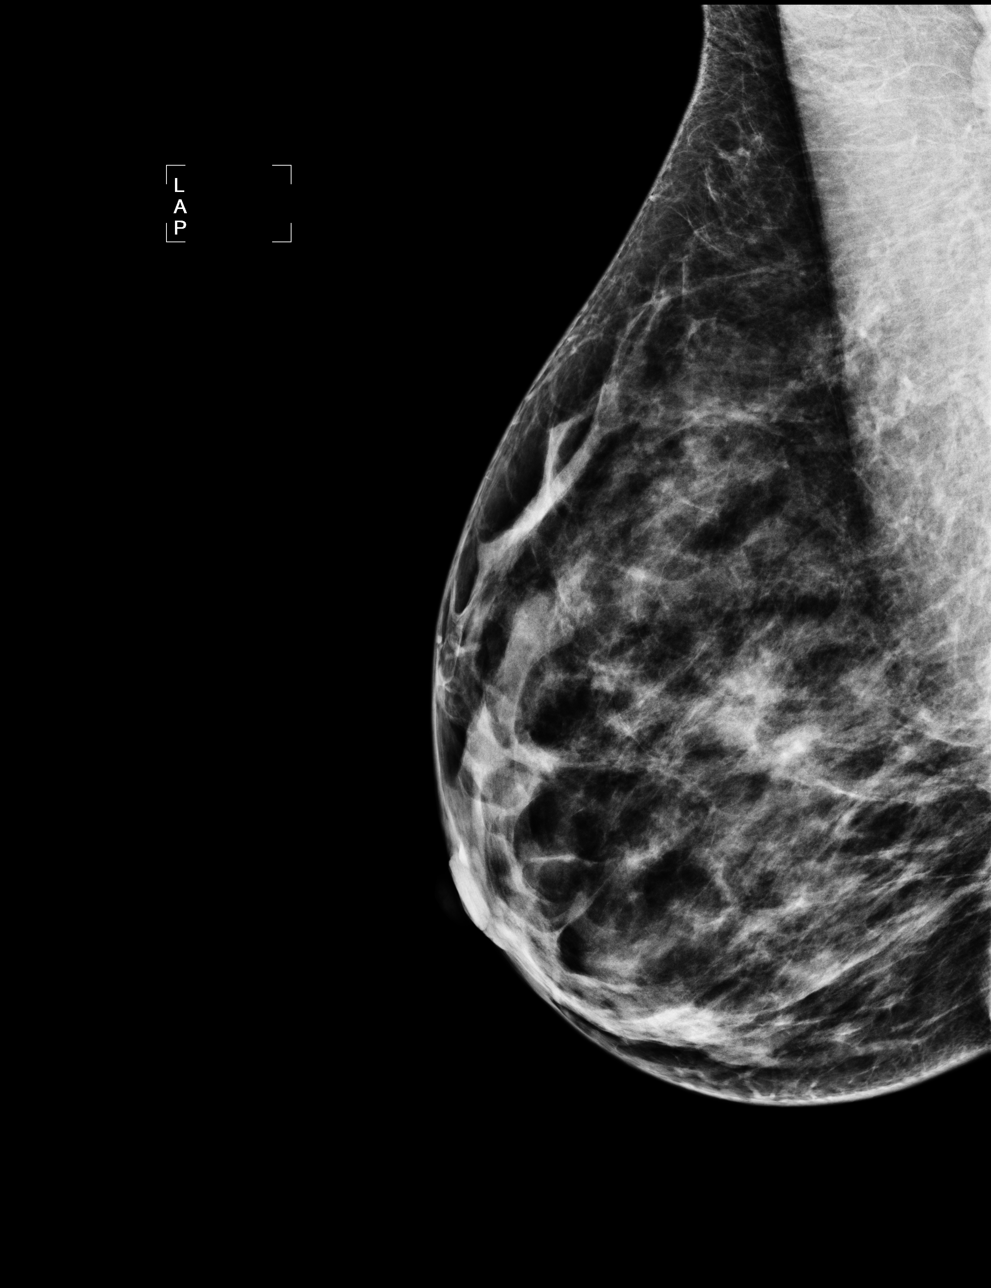

[4 of 4 positions shown; findings below may reference images not displayed]

FINDINGS: Stable heterogeneously dense parenchyma in both breasts.
No significant change in the previously demonstrated probable
fibroadenoma in the left breast, faintly visible in the central
portion of the breast on the craniocaudal view and faintly visible
in the superior aspect of the breast on the oblique view.  No new
findings suspicious for malignancy in either breast.
Mammographic images were processed with CAD.

On physical exam, there is an approximately 1 cm rounded, faintly
palpable mass in the 11:30 o'clock position of the left breast, 6
cm from the nipple.

Ultrasound is performed, showing a 1.6 x 1.2 x 0.7 cm oval,
horizontally oriented, smoothly marginated, mildly macrolobulated,
hypoechoic mass in the 11:30 o'clock position of the left breast, 6
cm from the nipple.  This measured 1.4 x 1.2 x 0.7 cm on
05/13/2010, 1.4 x 1.1 x 0.6 cm on 03/22/2010 and 1.4 x 1.0 x 0.7 cm
on 08/09/2009.
IMPRESSION: Stable left breast fibroadenoma.  The greater than 2-year stability
is compatible with a benign process.  No evidence of malignancy.
Annual screening mammography is recommended.

RECOMMENDATION:
Bilateral screening mammogram in 1 year.

BI-RADS CATEGORY 2:  Benign finding(s).

## 2013-12-29 ENCOUNTER — Encounter (HOSPITAL_COMMUNITY): Payer: Self-pay | Admitting: Obstetrics and Gynecology

## 2014-01-20 ENCOUNTER — Other Ambulatory Visit: Payer: Self-pay

## 2014-01-20 DIAGNOSIS — Z1231 Encounter for screening mammogram for malignant neoplasm of breast: Secondary | ICD-10-CM

## 2014-03-09 ENCOUNTER — Other Ambulatory Visit: Payer: Self-pay

## 2014-03-09 ENCOUNTER — Ambulatory Visit
Admission: RE | Admit: 2014-03-09 | Discharge: 2014-03-09 | Disposition: A | Discharge status: Home / Self Care | Payer: Medicare Other | Source: Ambulatory Visit

## 2014-03-09 DIAGNOSIS — Z1231 Encounter for screening mammogram for malignant neoplasm of breast: Secondary | ICD-10-CM

## 2014-10-02 ENCOUNTER — Emergency Department
Admission: EM | Admit: 2014-10-02 | Discharge: 2014-10-03 | Disposition: A | Discharge status: Home / Self Care | Payer: Medicare Other | Attending: Emergency Medicine | Admitting: Emergency Medicine

## 2014-10-02 DIAGNOSIS — I10 Essential (primary) hypertension: Secondary | ICD-10-CM | POA: Diagnosis not present

## 2014-10-02 DIAGNOSIS — R1031 Right lower quadrant pain: Secondary | ICD-10-CM | POA: Insufficient documentation

## 2014-10-02 DIAGNOSIS — Z79899 Other long term (current) drug therapy: Secondary | ICD-10-CM | POA: Diagnosis not present

## 2014-10-02 DIAGNOSIS — Z7982 Long term (current) use of aspirin: Secondary | ICD-10-CM | POA: Insufficient documentation

## 2014-10-02 DIAGNOSIS — R109 Unspecified abdominal pain: Secondary | ICD-10-CM

## 2014-10-02 HISTORY — DX: Acquired absence of ovaries, unilateral: Z90.721

## 2014-10-02 LAB — URINALYSIS COMPLETE WITH MICROSCOPIC (ARMC ONLY)
BACTERIA UA: NONE SEEN
Bilirubin Urine: NEGATIVE
Glucose, UA: NEGATIVE mg/dL
Hgb urine dipstick: NEGATIVE
Ketones, ur: NEGATIVE mg/dL
LEUKOCYTES UA: NEGATIVE
NITRITE: NEGATIVE
Protein, ur: NEGATIVE mg/dL
Specific Gravity, Urine: 1.021 (ref 1.005–1.030)
pH: 5 (ref 5.0–8.0)

## 2014-10-02 LAB — CBC
HEMATOCRIT: 38.2 % (ref 35.0–47.0)
HEMOGLOBIN: 12.7 g/dL (ref 12.0–16.0)
MCH: 30 pg (ref 26.0–34.0)
MCHC: 33.2 g/dL (ref 32.0–36.0)
MCV: 90.5 fL (ref 80.0–100.0)
Platelets: 211 10*3/uL (ref 150–440)
RBC: 4.22 MIL/uL (ref 3.80–5.20)
RDW: 12.4 % (ref 11.5–14.5)
WBC: 3 10*3/uL — ABNORMAL LOW (ref 3.6–11.0)

## 2014-10-02 LAB — COMPREHENSIVE METABOLIC PANEL
ALK PHOS: 107 U/L (ref 38–126)
ALT: 39 U/L (ref 14–54)
ANION GAP: 7 (ref 5–15)
AST: 41 U/L (ref 15–41)
Albumin: 4.2 g/dL (ref 3.5–5.0)
BUN: 15 mg/dL (ref 6–20)
CO2: 30 mmol/L (ref 22–32)
Calcium: 9 mg/dL (ref 8.9–10.3)
Chloride: 102 mmol/L (ref 101–111)
Creatinine, Ser: 0.68 mg/dL (ref 0.44–1.00)
GFR calc non Af Amer: >60 mL/min (ref >60)
Glucose, Bld: 89 mg/dL (ref 65–99)
Potassium: 3.9 mmol/L (ref 3.5–5.1)
SODIUM: 139 mmol/L (ref 135–145)
Total Bilirubin: 0.5 mg/dL (ref 0.3–1.2)
Total Protein: 7.5 g/dL (ref 6.5–8.1)

## 2014-10-02 LAB — LIPASE, BLOOD: LIPASE: 36 U/L (ref 22–51)

## 2014-10-02 NOTE — ED Notes (Signed)
Pt to ED c/o continued R sided pain.

## 2014-10-03 ENCOUNTER — Emergency Department: Payer: Medicare Other

## 2014-10-03 DIAGNOSIS — R1031 Right lower quadrant pain: Secondary | ICD-10-CM | POA: Diagnosis not present

## 2014-10-03 MED ORDER — IOHEXOL 240 MG/ML SOLN
50.0000 mL | INTRAMUSCULAR | Status: AC
  Administered 2014-10-03: 25 mL via ORAL

## 2014-10-03 MED ORDER — IOHEXOL 300 MG/ML  SOLN
100.0000 mL | Freq: Once | INTRAMUSCULAR | Status: AC | PRN
  Administered 2014-10-03: 100 mL via INTRAVENOUS

## 2014-10-03 NOTE — ED Provider Notes (Signed)
[facility] Emergency Department Provider Note  ____________________________________________  Time seen: Approximately 0032 AM  I have reviewed the triage vital signs and the nursing notes.   HISTORY  Chief Complaint Abdominal Pain    HPI [name] is a 44 y.o. female who comes in today with right-sided abdominal pain. The patient reports that she is concerned about her appendix. She reports that she has been having some right lower quadrant and right flank pain on and off for a couple of months. She reports that it seemed as though the right side with swelling today and she became concerned. She reports that the pain is been worse lately. She wanted to get checked out to make sure she did not have appendicitis. The patient reports that she has not been taking anything for pain but she does take baby aspirin daily. She reports that her pain is currently a 5 out of 10 in intensity and is sometimes sharp and achy. The patient denies any fevers no vomiting no nausea and no dizziness no chest pain or shortness of breath.   Past Medical History  Diagnosis Date  . GERD (gastroesophageal reflux disease)     on nexium-instructed to take dos  . Anemia   . Anxiety   . Arthritis     lower back  . Hypertension     controlled on hctz-b/p 142/93 at pat visit-will do ekg at pat visit  . Fibroadenoma of breast     left breast  . Liver mass 12/2007    benign- per pt " no longer there"  . Ovarian cyst     h/o  . Fibroids     h/o  . Endocervical polyp     h/o  . Insomnia   . H/O hypercholesterolemia   . Depression   . History of right oophorectomy     Patient Active Problem List   Diagnosis Date Noted  . Kidney stone 05/06/2012  . Dense breasts 02/05/2012  . Anemia   . Anxiety   . Arthritis   . Hypertension   . Liver mass   . Ovarian cyst   . Insomnia   . H/O hypercholesterolemia   . GERD (gastroesophageal reflux disease) 11/29/2010  .  Fibroadenoma of breast 11/11/2009    Past Surgical History  Procedure Laterality Date  . Ectopic pregnancy surgery  1992  . Myomectomy  2006  . Laparoscopic assisted vaginal hysterectomy  11/29/2010    Procedure: LAPAROSCOPIC ASSISTED VAGINAL HYSTERECTOMY;  Surgeon: Eldred Manges, MD;  Location: [facility];  Service: Gynecology;  Laterality: N/A;  . Colon surgery    . Abdominal hysterectomy  11/29/2010  . Laparoscopy N/A 06/12/2012    Procedure: LAPAROSCOPY OPERATIVE;  Surgeon: Eldred Manges, MD;  Location: [facility];  Service: Gynecology;  Laterality: N/A;  . Bilateral salpingectomy Bilateral 06/12/2012    Procedure: BILATERAL SALPINGECTOMY;  Surgeon: Eldred Manges, MD;  Location: [facility];  Service: Gynecology;  Laterality: Bilateral;    Current Outpatient Rx  Name  Route  Sig  Dispense  Refill  . hydrochlorothiazide (HYDRODIURIL) 25 MG tablet   Oral   Take 25 mg by mouth daily.          . Hydrocodone-Acetaminophen (VICODIN) 5-300 MG TABS      1or 2 tablets every 4 hours as needed. For pain   40 each   0   . ibuprofen (ADVIL,MOTRIN) 600 MG tablet      Ibuprofen 600 mg every 6 hours  for 3 days and then as needed. For pain   60 tablet   0   . mirtazapine (REMERON) 30 MG tablet   Oral   Take 30 mg by mouth at bedtime.         . pantoprazole (PROTONIX) 20 MG tablet   Oral   Take 1 tablet (20 mg total) by mouth daily.   30 tablet   3     Allergies Oxycodone-acetaminophen; Shellfish allergy; Sulfa antibiotics; and Tetracyclines & related  Family History  Problem Relation Age of Onset  . Pancreatic cancer Maternal Grandmother     Social History History  Substance Use Topics  . Smoking status: Never Smoker   . Smokeless tobacco: Never Used  . Alcohol Use: Yes     Comment: occasionally     Review of Systems Constitutional: No fever/chills Eyes: No visual changes. ENT: No sore throat. Cardiovascular: Denies chest pain. Respiratory: Denies shortness of  breath. Gastrointestinal: abdominal pain.  No nausea, no vomiting.  Genitourinary: Negative for dysuria. Musculoskeletal: right back pain. Skin: Negative for rash. Neurological: Negative for headaches, focal weakness or numbness.  10-point ROS otherwise negative.  ____________________________________________   PHYSICAL EXAM:  VITAL SIGNS: ED Triage Vitals  Enc Vitals Group     BP 10/02/14 1942 192/109 mmHg     Pulse Rate 10/02/14 1942 75     Resp 10/02/14 1942 18     Temp 10/02/14 1942 98.1 F (36.7 C)     Temp Source 10/02/14 1942 Oral     SpO2 10/02/14 1942 100 %     Weight 10/02/14 1942 110 lb (49.896 kg)     Height 10/02/14 1942 4\' 11"  (1.499 m)     Head Cir --      Peak Flow --      Pain Score 10/02/14 1943 6     Pain Loc --      Pain Edu? --      Excl. in [place]? --     Constitutional: Alert and oriented. Well appearing and in moderate distress. Eyes: Conjunctivae are normal. PERRL. EOMI. Head: Atraumatic. Nose: No congestion/rhinnorhea. Mouth/Throat: Mucous membranes are moist.  Oropharynx non-erythematous. Cardiovascular: Normal rate, regular rhythm. Grossly normal heart sounds.  Good peripheral circulation. Respiratory: Normal respiratory effort.  No retractions. Lungs CTAB. Gastrointestinal: Soft with right lower quadrant tenderness to palpation. No distention. Positive bowel sounds. Genitourinary: Deferred Musculoskeletal: No lower extremity tenderness nor edema.   Neurologic:  Normal speech and language.  Skin:  Skin is warm, dry and intact. No rash noted. Psychiatric: Mood and affect are normal.   ____________________________________________   LABS (all labs ordered are listed, but only abnormal results are displayed)  Labs Reviewed  CBC - Abnormal; Notable for the following:    WBC 3.0 (*)    All other components within normal limits  URINALYSIS COMPLETEWITH MICROSCOPIC ([facility] ONLY) - Abnormal; Notable for the following:    Color, Urine YELLOW (*)     APPearance CLEAR (*)    Squamous Epithelial / LPF 0-5 (*)    All other components within normal limits  LIPASE, BLOOD  COMPREHENSIVE METABOLIC PANEL   ____________________________________________  EKG  None ____________________________________________  RADIOLOGY  CT abdomen and pelvis: No acute findings are evident in the abdomen or pelvis, benign liver lesions again noted, no significant abnormality. ____________________________________________   PROCEDURES  Procedure(s) performed: None  Critical Care performed: No  ____________________________________________   INITIAL IMPRESSION / ASSESSMENT AND PLAN / ED COURSE  Pertinent labs &  imaging results that were available during my care of the patient were reviewed by me and considered in my medical decision making (see chart for details).  This is a 36 rolled female who comes in today with some right lower quadrant tenderness to palpation that has been going on and off for a few months. The pain has been worse in the last few days that the patient decided to come in for evaluation. The patient's blood work is unremarkable but I will do a CT scan to evaluate for possible appendicitis.  The patient's CT scan is unremarkable. I will discharge her to follow-up with her primary care physician for further evaluation of her intermittent abdominal pain. ____________________________________________   FINAL CLINICAL IMPRESSION(S) / ED DIAGNOSES  Final diagnoses:  Right lower quadrant abdominal pain  Flank pain      Loney Hering, MD 10/03/14 [phone]

## 2014-10-03 NOTE — Discharge Instructions (Signed)
Abdominal Pain Many things can cause abdominal pain. Usually, abdominal pain is not caused by a disease and will improve without treatment. It can often be observed and treated at home. Your health care provider will do a physical exam and possibly order blood tests and X-rays to help determine the seriousness of your pain. However, in many cases, more time must pass before a clear cause of the pain can be found. Before that point, your health care provider may not know if you need more testing or further treatment. HOME CARE INSTRUCTIONS  Monitor your abdominal pain for any changes. The following actions may help to alleviate any discomfort you are experiencing:  Only take over-the-counter or prescription medicines as directed by your health care provider.  Do not take laxatives unless directed to do so by your health care provider.  Try a clear liquid diet (broth, tea, or water) as directed by your health care provider. Slowly move to a bland diet as tolerated. SEEK MEDICAL CARE IF:  You have unexplained abdominal pain.  You have abdominal pain associated with nausea or diarrhea.  You have pain when you urinate or have a bowel movement.  You experience abdominal pain that wakes you in the night.  You have abdominal pain that is worsened or improved by eating food.  You have abdominal pain that is worsened with eating fatty foods.  You have a fever. SEEK IMMEDIATE MEDICAL CARE IF:   Your pain does not go away within 2 hours.  You keep throwing up (vomiting).  Your pain is felt only in portions of the abdomen, such as the right side or the left lower portion of the abdomen.  You pass bloody or black tarry stools. MAKE SURE YOU:  Understand these instructions.   Will watch your condition.   Will get help right away if you are not doing well or get worse.  Document Released: 11/23/2004 Document Revised: 02/18/2013 Document Reviewed: 10/23/2012 [facility] Patient Information  2015 [place], [place]. This information is not intended to replace advice given to you by your health care provider. Make sure you discuss any questions you have with your health care provider.  Flank Pain Flank pain is pain in your side. The flank is the area of your side between your upper belly (abdomen) and your back. Pain in this area can be caused by many different things. [place] care and treatment will depend on the cause of your pain.  Rest as told by your doctor.  Drink enough fluids to keep your pee (urine) clear or pale yellow.  Only take medicine as told by your doctor.  Tell your doctor about any changes in your pain.  Follow up with your doctor. GET HELP RIGHT AWAY IF:   Your pain does not get better with medicine.   You have new symptoms or your symptoms get worse.  Your pain gets worse.   You have belly (abdominal) pain.   You are short of breath.   You always feel sick to your stomach (nauseous).   You keep throwing up (vomiting).   You have puffiness (swelling) in your belly.   You feel light-headed or you pass out (faint).   You have blood in your pee.  You have a fever or lasting symptoms for more than 2-3 days.  You have a fever and your symptoms suddenly get worse. MAKE SURE YOU:   Understand these instructions.  Will watch your condition.  Will get help right away if you  are not doing well or get worse. Document Released: 11/23/2007 Document Revised: 06/30/2013 Document Reviewed: 09/28/2011 [facility] Patient Information 2015 [place], [place]. This information is not intended to replace advice given to you by your health care provider. Make sure you discuss any questions you have with your health care provider.

## 2016-07-17 ENCOUNTER — Other Ambulatory Visit: Payer: Self-pay | Admitting: Obstetrics and Gynecology

## 2016-07-17 DIAGNOSIS — Z1231 Encounter for screening mammogram for malignant neoplasm of breast: Secondary | ICD-10-CM

## 2017-05-02 ENCOUNTER — Other Ambulatory Visit: Payer: Self-pay | Admitting: Family Medicine

## 2017-05-02 DIAGNOSIS — N644 Mastodynia: Secondary | ICD-10-CM

## 2017-05-07 ENCOUNTER — Other Ambulatory Visit: Payer: Medicare Other

## 2017-06-01 ENCOUNTER — Other Ambulatory Visit: Payer: Medicare Other

## 2017-06-18 ENCOUNTER — Other Ambulatory Visit: Payer: Medicare Other

## 2017-07-02 ENCOUNTER — Inpatient Hospital Stay: Admission: RE | Admit: 2017-07-02 | Payer: Medicare Other | Source: Ambulatory Visit

## 2017-07-02 ENCOUNTER — Other Ambulatory Visit: Payer: Medicare Other

## 2017-08-03 ENCOUNTER — Other Ambulatory Visit: Payer: Medicare Other

## 2017-08-20 ENCOUNTER — Other Ambulatory Visit: Payer: Medicare Other

## 2017-09-03 ENCOUNTER — Other Ambulatory Visit: Payer: Medicare Other

## 2017-10-01 ENCOUNTER — Other Ambulatory Visit: Payer: Medicare Other

## 2017-10-05 ENCOUNTER — Other Ambulatory Visit: Payer: Medicare Other

## 2017-11-02 ENCOUNTER — Other Ambulatory Visit: Payer: Medicare Other

## 2017-11-05 ENCOUNTER — Other Ambulatory Visit: Payer: Medicare Other

## 2017-11-12 ENCOUNTER — Ambulatory Visit
Admission: RE | Admit: 2017-11-12 | Discharge: 2017-11-12 | Disposition: A | Discharge status: Home / Self Care | Payer: Medicare Other | Source: Ambulatory Visit | Attending: Family Medicine | Admitting: Family Medicine

## 2017-11-12 DIAGNOSIS — N644 Mastodynia: Secondary | ICD-10-CM

## 2020-03-08 NOTE — Progress Notes (Unsigned)
Patient was no-show for appointment.  The office staff will contact the patient for rescheduling follow-up. 

## 2020-03-09 ENCOUNTER — Ambulatory Visit: Payer: Medicare Other

## 2020-05-04 NOTE — Progress Notes (Signed)
[facility] [place], [place] [address]  Pulmonary Sleep Medicine   Office Visit Note  Patient Name: [name] DOB: 26-Jan-1971 MRN [id number]  I connected with  Maris Berger on 05/05/20 by a video enabled telemedicine application and verified that I am speaking with the correct person using two identifiers.   I discussed the limitations of evaluation and management by telemedicine. The patient expressed understanding and agreed to proceed.   Chief Complaint: Obstructive Sleep Apnea visit  Brief History:  [name] is seen today for follow up. The patient has a 1 year history of sleep apnea. Patient is using PAP nightly.  The patient feels better after sleeping with PAP.  The patient reports benefit from PAP use. Reported sleepiness is improved and the Epworth Sleepiness Score is 5 out of 24. The patient does not take naps. The patient complains of the following: none  The compliance download shows  compliance with an average use time of 7 hours. The AHI is 0.4  The patient does not of limb movements disrupting sleep.  ROS  General: (-) fever, (-) chills, (-) night sweat Nose and Sinuses: (-) nasal stuffiness or itchiness, (-) postnasal drip, (-) nosebleeds, (-) sinus trouble. Mouth and Throat: (-) sore throat, (-) hoarseness. Neck: (-) swollen glands, (-) enlarged thyroid, (-) neck pain. Respiratory: - cough, - shortness of breath, - wheezing. Neurologic: - numbness, - tingling. Psychiatric: - anxiety, - depression   Current Medication: Outpatient Encounter Medications as of 05/05/2020  Medication Sig  . amLODipine (NORVASC) 5 MG tablet Take 5 mg by mouth daily.  Marland Kitchen aspirin 81 MG EC tablet Take by mouth.  Marland Kitchen ibuprofen (ADVIL,MOTRIN) 600 MG tablet Ibuprofen 600 mg every 6 hours for 3 days and then as needed. For pain  . [DISCONTINUED] hydrochlorothiazide (HYDRODIURIL) 25 MG tablet Take 25 mg by mouth daily.   . [DISCONTINUED]  Hydrocodone-Acetaminophen (VICODIN) 5-300 MG TABS 1or 2 tablets every 4 hours as needed. For pain  . [DISCONTINUED] mirtazapine (REMERON) 30 MG tablet Take 30 mg by mouth at bedtime.  . [DISCONTINUED] pantoprazole (PROTONIX) 20 MG tablet Take 1 tablet (20 mg total) by mouth daily.   No facility-administered encounter medications on file as of 05/05/2020.    Surgical History: Past Surgical History:  Procedure Laterality Date  . ABDOMINAL HYSTERECTOMY  11/29/2010  . BILATERAL SALPINGECTOMY Bilateral 06/12/2012   Procedure: BILATERAL SALPINGECTOMY;  Surgeon: Eldred Manges, MD;  Location: [facility];  Service: Gynecology;  Laterality: Bilateral;  . COLON SURGERY    . [place]  . LAPAROSCOPIC ASSISTED VAGINAL HYSTERECTOMY  11/29/2010   Procedure: LAPAROSCOPIC ASSISTED VAGINAL HYSTERECTOMY;  Surgeon: Eldred Manges, MD;  Location: [facility];  Service: Gynecology;  Laterality: N/A;  . LAPAROSCOPY N/A 06/12/2012   Procedure: LAPAROSCOPY OPERATIVE;  Surgeon: Eldred Manges, MD;  Location: [facility];  Service: Gynecology;  Laterality: N/A;  . MYOMECTOMY  2006    Medical History: Past Medical History:  Diagnosis Date  . Anemia   . Anxiety   . Arthritis    lower back  . Depression   . Endocervical polyp    h/o  . Fibroadenoma of breast    left breast  . Fibroids    h/o  . GERD (gastroesophageal reflux disease)    on nexium-instructed to take dos  . H/O hypercholesterolemia   . History of right oophorectomy   . Hypertension    controlled on hctz-b/p 142/93 at pat visit-will do  ekg at pat visit  . Insomnia   . Liver mass 12/2007   benign- per pt " no longer there"  . Ovarian cyst    h/o    Family History: Non contributory to the present illness  Social History: Social History   Socioeconomic History  . Marital status: Single    Spouse name: Not on file  . Number of children: Not on file  . Years of education: Not on file  . Highest education level: Not  on file  Occupational History  . Not on file  Tobacco Use  . Smoking status: Never Smoker  . Smokeless tobacco: Never Used  Substance and Sexual Activity  . Alcohol use: Yes    Comment: occasionally   . Drug use: No  . Sexual activity: Not Currently    Birth control/protection: Surgical    Comment: hysterectomy   Other Topics Concern  . Not on file  Social History Narrative  . Not on file   Social Determinants of Health   Financial Resource Strain: Not on file  Food Insecurity: Not on file  Transportation Needs: Not on file  Physical Activity: Not on file  Stress: Not on file  Social Connections: Not on file  Intimate Partner Violence: Not on file    Vital Signs: Last menstrual period 11/03/2010.  Examination: General Appearance: The patient is well-developed, well-nourished, and in no distress. Neck Circumference:  Skin: Gross inspection of skin unremarkable. Head: normocephalic, no gross deformities. Eyes: no gross deformities noted. ENT: ears appear grossly normal Neurologic: Alert and oriented. No involuntary movements.    EPWORTH SLEEPINESS SCALE:  Scale:  (0)= no chance of dozing; (1)= slight chance of dozing; (2)= moderate chance of dozing; (3)= high chance of dozing  Chance  Situtation    Sitting and reading: 1    Watching TV: 0    Sitting Inactive in public: 0    As a passenger in car: 0      Lying down to rest: 3    Sitting and talking: 0    Sitting quielty after lunch: 1    In a car, stopped in traffic: 0   TOTAL SCORE:   5 out of 24    SLEEP STUDIES:  1. PSG 01/28/19 AHI 6 Spo67min 85%  CPAP COMPLIANCE DATA:  Date Range: 01/03/20-05/01/20  Average Daily Use: 6.9 hours  Median Use: 7  Compliance for > 4 Hours: 87%   AHI: 0.4 respiratory events per hour  Days Used: 109/120  Mask Leak: 4.7  95th Percentile Pressure: 5         LABS: No results found for this or any previous visit (from the past 2160  hour(s)).  Radiology: US BREAST LTD UNI RIGHT INC AXILLA  Result Date: 11/12/2017 CLINICAL DATA:  Right lateral breast pain. EXAM: DIGITAL DIAGNOSTIC BILATERAL MAMMOGRAM WITH CAD AND TOMO ULTRASOUND RIGHT BREAST COMPARISON:  Previous exam(s). ACR Breast Density Category c: The breast tissue is heterogeneously dense, which may obscure small masses. FINDINGS: Mammographically, there are no new suspicious masses, areas of architectural distortion or microcalcifications in either breast. There is a stable circumscribed mass in the left breast upper slightly inner quadrant, posterior depth, which in correlation to prior diagnostic workup represents a fibroadenoma. Mammographic images were processed with CAD. On physical exam, no suspicious masses are palpated. Targeted ultrasound is performed, showing no suspicious masses or shadowing lesions in the area of pain in the lateral right breast. IMPRESSION: No mammographic or sonographic evidence of malignancy  in either breast. RECOMMENDATION: Further management of patient's right breast pain should be based on clinical grounds. Otherwise, screening mammogram in one year.(Code:SM-B-01Y) I have discussed the findings and recommendations with the patient. Results were also provided in writing at the conclusion of the visit. If applicable, a reminder letter will be sent to the patient regarding the next appointment. BI-RADS CATEGORY  2: Benign. Electronically Signed   By: Fidela Salisbury M.D.   On: 11/12/2017 13:50   MM DIAG BREAST TOMO BILATERAL  Result Date: 11/12/2017 CLINICAL DATA:  Right lateral breast pain. EXAM: DIGITAL DIAGNOSTIC BILATERAL MAMMOGRAM WITH CAD AND TOMO ULTRASOUND RIGHT BREAST COMPARISON:  Previous exam(s). ACR Breast Density Category c: The breast tissue is heterogeneously dense, which may obscure small masses. FINDINGS: Mammographically, there are no new suspicious masses, areas of architectural distortion or microcalcifications in either  breast. There is a stable circumscribed mass in the left breast upper slightly inner quadrant, posterior depth, which in correlation to prior diagnostic workup represents a fibroadenoma. Mammographic images were processed with CAD. On physical exam, no suspicious masses are palpated. Targeted ultrasound is performed, showing no suspicious masses or shadowing lesions in the area of pain in the lateral right breast. IMPRESSION: No mammographic or sonographic evidence of malignancy in either breast. RECOMMENDATION: Further management of patient's right breast pain should be based on clinical grounds. Otherwise, screening mammogram in one year.(Code:SM-B-01Y) I have discussed the findings and recommendations with the patient. Results were also provided in writing at the conclusion of the visit. If applicable, a reminder letter will be sent to the patient regarding the next appointment. BI-RADS CATEGORY  2: Benign. Electronically Signed   By: Fidela Salisbury M.D.   On: 11/12/2017 13:50    No results found.  No results found.    Assessment and Plan: Patient Active Problem List   Diagnosis Date Noted  . Kidney stone 05/06/2012  . Dense breasts 02/05/2012  . Anemia   . Anxiety   . Arthritis   . Hypertension   . Liver mass   . Ovarian cyst   . Insomnia   . H/O hypercholesterolemia   . GERD (gastroesophageal reflux disease) 11/29/2010  . Fibroadenoma of breast 11/11/2009      The patient can tolerate PAP and reports  benefit from PAP use. The patient was reminded how to adjust mask and advised to wear nightly. The compliance is good. The AHI is 0.4.  1. OSA (obstructive sleep apnea) Continue nightly use.  2. CPAP use counseling CPAP couseling-Discussed importance of adequate CPAP use as well as proper care and cleaning techniques of machine and all supplies.  3. Primary hypertension Continue amlodipine and f/u with PCP.  4. Gastroesophageal reflux disease without esophagitis No  longer taking anything for this and no complaints.  5. Anxiety Not currently a problem and not on nay medications for this.   General Counseling: I have discussed the findings of the evaluation and examination with [name].  I have also discussed any further diagnostic evaluation thatmay be needed or ordered today. [name] verbalizes understanding of the findings of todays visit. We also reviewed her medications today and discussed drug interactions and side effects including but not limited excessive drowsiness and altered mental states. We also discussed that there is always a risk not just to her but also people around her. she has been encouraged to call the office with any questions or concerns that should arise related to todays visit.  No orders of the defined types were  placed in this encounter.     I have personally obtained a history, examined the patient, evaluated laboratory and imaging results, formulated the assessment and plan and placed orders.  This patient was seen by Drema Dallas, PA-C in collaboration with Dr. Devona Konig as a part of collaborative care agreement.   Richelle Ito Saunders Glance, PhD, FAASM  Diplomate, American Board of Sleep Medicine    Allyne Gee, MD [facility] Diplomate ABMS Pulmonary and Critical Care Medicine Sleep medicine

## 2020-05-05 ENCOUNTER — Ambulatory Visit (INDEPENDENT_AMBULATORY_CARE_PROVIDER_SITE_OTHER): Payer: Medicare Other | Admitting: Internal Medicine

## 2020-05-05 DIAGNOSIS — G4733 Obstructive sleep apnea (adult) (pediatric): Secondary | ICD-10-CM | POA: Diagnosis not present

## 2020-05-05 DIAGNOSIS — Z7189 Other specified counseling: Secondary | ICD-10-CM

## 2020-05-05 DIAGNOSIS — I10 Essential (primary) hypertension: Secondary | ICD-10-CM

## 2020-05-05 DIAGNOSIS — K219 Gastro-esophageal reflux disease without esophagitis: Secondary | ICD-10-CM

## 2020-05-05 DIAGNOSIS — F419 Anxiety disorder, unspecified: Secondary | ICD-10-CM

## 2020-05-05 NOTE — Patient Instructions (Signed)

## 2020-06-02 ENCOUNTER — Other Ambulatory Visit: Payer: Self-pay | Admitting: Obstetrics & Gynecology

## 2020-06-02 DIAGNOSIS — Z1231 Encounter for screening mammogram for malignant neoplasm of breast: Secondary | ICD-10-CM

## 2020-07-23 ENCOUNTER — Ambulatory Visit: Payer: Medicare Other

## 2020-08-02 ENCOUNTER — Other Ambulatory Visit: Payer: Self-pay

## 2020-08-02 ENCOUNTER — Ambulatory Visit
Admission: RE | Admit: 2020-08-02 | Discharge: 2020-08-02 | Disposition: A | Discharge status: Home / Self Care | Payer: Medicare Other | Source: Ambulatory Visit | Attending: Obstetrics & Gynecology | Admitting: Obstetrics & Gynecology

## 2020-08-02 DIAGNOSIS — Z1231 Encounter for screening mammogram for malignant neoplasm of breast: Secondary | ICD-10-CM

## 2021-04-18 NOTE — Progress Notes (Signed)
Office Visit Note  Patient: [name]             Date of Birth: February 21, 1971           MRN: [id number]             PCP: Lorelee Market, MD Referring: Celene Squibb, MD Visit Date: 04/26/2021 Occupation: _0 @  Subjective:  New Patient (Initial Visit) (Abnormal labs, bil hand pain)   History of Present Illness: [name] is a 51 y.o. female seen in consultation per request of her PCP.  According to the patient She used to work as a Engineer, civil (consulting) several years ago and started causing numbness in her bilateral hands and she quit working.  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  She restarted working about a year ago since then she has been experiencing increased pain and discomfort in her bilateral hands.  She notices swelling in her hands and has difficulty making a fist.  She has noticed decreased grip strength.  She is also had off-and-on discomfort in her left shoulder.  She states in January 2023 she was given a prednisone taper for 7 days due to pain and swelling in her hands and left shoulder pain.  None of the other joints are painful.  There is family history of rheumatoid arthritis in her mother.  She is gravida 1, para 1.  There is no history of DVTs.  Activities of Daily Living:  Patient reports morning stiffness for 24 hours.   Patient Reports nocturnal pain.  Difficulty dressing/grooming: Reports Difficulty climbing stairs: Denies Difficulty getting out of chair: Denies Difficulty using hands for taps, buttons, cutlery, and/or writing: Reports  Review of Systems  Constitutional:  Positive for fatigue.  HENT:  Negative for mouth sores and mouth dryness.   Eyes:  Negative for dryness.  Respiratory:  Negative for shortness of breath.   Cardiovascular:  Negative for chest pain and swelling in legs/feet.  Gastrointestinal:  Negative for abdominal pain, blood in stool,  constipation and diarrhea.  Endocrine: Positive for heat intolerance.  Genitourinary:  Negative for difficulty urinating.  Musculoskeletal:  Positive for joint pain, joint pain, joint swelling and morning stiffness.  Skin:  Negative for color change, rash and sensitivity to sunlight.  Allergic/Immunologic: Negative for susceptible to infections.  Neurological:  Positive for numbness.  Hematological:  Positive for bruising/bleeding tendency. Negative for swollen glands.  Psychiatric/Behavioral:  Positive for sleep disturbance. Negative for depressed mood. The patient is not nervous/anxious.    PMFS History:  Patient Active Problem List   Diagnosis Date Noted   Polyarthralgia 04/26/2021   Kidney stone 05/06/2012   Dense breasts 02/05/2012   Anemia    Anxiety    Arthritis    Hypertension    Liver mass    Ovarian cyst    Insomnia    H/O hypercholesterolemia    GERD (gastroesophageal reflux disease) 11/29/2010   Fibroadenoma of breast 11/11/2009    Past Medical History:  Diagnosis Date   Anemia    Anxiety    Arthritis    lower back   Depression    Endocervical polyp    h/o   Fibroadenoma of breast    left breast   Fibroids    h/o   GERD (gastroesophageal reflux disease)    on nexium-instructed to take dos   H/O hypercholesterolemia    History of  right oophorectomy    Hypertension    controlled on hctz-b/p 142/93 at pat visit-will do ekg at pat visit   Insomnia    Liver mass 12/2007   benign- per pt " no longer there"   Ovarian cyst    h/o    Family History  Problem Relation Age of Onset   Rheum arthritis Mother    Pancreatic cancer Maternal Grandmother    Past Surgical History:  Procedure Laterality Date   ABDOMINAL HYSTERECTOMY  11/29/2010   BILATERAL SALPINGECTOMY Bilateral 06/12/2012   Procedure: BILATERAL SALPINGECTOMY;  Surgeon: Eldred Manges, MD;  Location: [facility];  Service: Gynecology;  Laterality: Bilateral;   COLON SURGERY     ECTOPIC PREGNANCY  SURGERY  1992   LAPAROSCOPIC ASSISTED VAGINAL HYSTERECTOMY  11/29/2010   Procedure: LAPAROSCOPIC ASSISTED VAGINAL HYSTERECTOMY;  Surgeon: Eldred Manges, MD;  Location: [facility];  Service: Gynecology;  Laterality: N/A;   LAPAROSCOPY N/A 06/12/2012   Procedure: LAPAROSCOPY OPERATIVE;  Surgeon: Eldred Manges, MD;  Location: [facility];  Service: Gynecology;  Laterality: N/A;   MYOMECTOMY  2006   Social History   Social History Narrative   Not on file    There is no immunization history on file for this patient.   Objective: Vital Signs: BP (!) 168/96 (BP Location: Right Arm, Patient Position: Sitting, Cuff Size: Normal)    Pulse 76    Resp 15    Ht 4' 11" (1.499 m)    Wt 128 lb (58.1 kg)    LMP 11/03/2010    BMI 25.85 kg/m    Physical Exam Vitals and nursing note reviewed.  Constitutional:      Appearance: She is well-developed.  HENT:     Head: Normocephalic and atraumatic.  Eyes:     Conjunctiva/sclera: Conjunctivae normal.  Cardiovascular:     Rate and Rhythm: Normal rate and regular rhythm.     Heart sounds: Normal heart sounds.  Pulmonary:     Effort: Pulmonary effort is normal.     Breath sounds: Normal breath sounds.  Abdominal:     General: Bowel sounds are normal.     Palpations: Abdomen is soft.  Musculoskeletal:     Cervical back: Normal range of motion.  Lymphadenopathy:     Cervical: No cervical adenopathy.  Skin:    General: Skin is warm and dry.     Capillary Refill: Capillary refill takes less than 2 seconds.  Neurological:     Mental Status: She is alert and oriented to person, place, and time.  Psychiatric:        Behavior: Behavior normal.     Musculoskeletal Exam: C-spine was in good range of motion.  Shoulder joints, elbow joints, wrist joints with good range of motion.  She has synovitis over several of her MCPs and PIPs as described below.  She had incomplete fist formation bilaterally.  She hip joints and knee joints with good range of motion.   She had no tenderness over ankles or MTPs.  CDAI Exam: CDAI Score: 11.3  Patient Global: 7 mm; Provider Global: 6 mm Swollen: 3 ; Tender: 7  Joint Exam 04/26/2021      Right  Left  Glenohumeral      Tender  MCP 3  Swollen Tender  Swollen Tender  IP     Swollen Tender  PIP 4   Tender   Tender  PIP 5      Tender     Investigation: No additional findings.  Imaging: XR Foot 2 Views Left  Result Date: 04/26/2021 No MTP narrowing was noted.  Mild PIP and DIP narrowing was noted.  No intertarsal, tibiotalar or subtalar joint space narrowing was noted.  Small inferior calcaneal spur was noted. 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  XR Foot 2 Views Right  Result Date: 04/26/2021 No MTP narrowing was noted.  Mild PIP and DIP narrowing was noted.  No intertarsal, tibiotalar or subtalar joint space narrowing was noted.  Small inferior calcaneal spur was noted. 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  XR Hand 2 View Left  Result Date: 04/26/2021 No MCP, PIP or DIP narrowing was noted.  No intercarpal or radiocarpal joint space narrowing was noted.  No CMC narrowing was noted.  No erosive changes were noted. Impression: Unremarkable x-ray of the hand.  XR Hand 2 View Right  Result Date: 04/26/2021 No MCP, PIP or DIP narrowing was noted.  No intercarpal or radiocarpal joint space narrowing was noted.  No CMC narrowing was noted.  No erosive changes were noted. Impression: Unremarkable x-ray of the hand.   Recent Labs: Lab Results  Component Value Date   WBC 3.0 (L) 10/02/2014   HGB 12.7 10/02/2014   PLT 211 10/02/2014   NA 139 10/02/2014   K 3.9 10/02/2014   CL 102 10/02/2014   CO2 30 10/02/2014   GLUCOSE 89 10/02/2014   BUN 15 10/02/2014   CREATININE 0.68 10/02/2014   BILITOT 0.5 10/02/2014   ALKPHOS 107 10/02/2014   AST 41 10/02/2014   ALT 39 10/02/2014   PROT 7.5 10/02/2014   ALBUMIN 4.2  10/02/2014   CALCIUM 9.0 10/02/2014   GFRAA >60 10/02/2014    Speciality Comments: No specialty comments available.  Procedures:  No procedures performed Allergies: Oxycodone-acetaminophen, Sulfa antibiotics, Tetracyclines & related, Hydrocodone, Oxycodone-acetaminophen, Shellfish allergy, and Tetracycline   Assessment / Plan:     Visit Diagnoses: Rheumatoid arthritis with rheumatoid factor of multiple sites without organ or systems involvement (HCC) -positive rheumatoid factor, elevated sedimentation rate, synovitis noted in multiple MCPs and PIPs.  She has been having pain and discomfort in her hands for the last 1 year.  She states she had paresthesias in the past when she used to work as a Engineer, civil (consulting).  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  She experiences paresthesias intermittently.  She is right-handed.  Detailed counsel regarding rheumatoid arthritis was provided.  Different treatment options and their side effects were discussed.  Due to aggressive nature of her disease I discussed the option of starting her on methotrexate.  My plan is to start her on methotrexate 6 tablets p.o. q. we can increase it to 8 tablets p.o. weekly along with folic acid 2 mg p.o. daily.  Indications side effects contraindications were discussed.  After labs are available we can start her on methotrexate.  I will start her on prednisone 20 mg p.o. daily for 1 week and then taper by 5 mg every week..  Side effects of prednisone were discussed at length.  Plan: Sedimentation rate, Cyclic citrul peptide antibody, IgG  Drug Counseling TB Gold: pending Hepatitis panel: Pending  Chest-xray: 2009  Contraception: Hysterectomy  Alcohol use: None  Patient was counseled on the purpose, proper use, and adverse effects of methotrexate including nausea, infection, and signs and symptoms of pneumonitis.  Reviewed instructions with patient to take methotrexate weekly along with folic acid  daily.  Discussed the importance of frequent monitoring of kidney and liver function and blood counts, and provided patient with standing lab instructions.  Counseled patient to avoid NSAIDs and alcohol while on methotrexate.  Provided patient with educational materials on methotrexate and answered all questions.  Advised patient to get annual influenza vaccine and to get a pneumococcal vaccine if patient has not already had one.  Patient voiced understanding.  Patient consented to methotrexate use.  Will upload into chart.     High risk medication use -I will obtain following labs today to start her on immunosuppressive agents.  Plan: CBC with Differential/Platelet, COMPLETE METABOLIC PANEL WITH GFR, Hepatitis B core antibody, IgM, Hepatitis B surface antigen, Hepatitis C antibody, QuantiFERON-TB Gold Plus, Serum protein electrophoresis with reflex, IgG, IgA, IgM.  Information regarding immunization was also placed in the AVS.  She was also advised to stop methotrexate in case she develops an infection and resume after the infection resolves.  Pain in both hands -she had pain swelling and synovitis in her bilateral hands.  I will obtain baseline x-rays for comparison.  Plan: XR Hand 2 View Right, XR Hand 2 View Left.  X-rays of bilateral hands were unremarkable.  Chronic left shoulder pain -she gives history of intermittent discomfort in her left shoulder joint for the last few years.  She had x-rays of her left shoulder joint in November 2022 which were unremarkable per report.  Pain in both feet -she denies ongoing pain in her feet.  I will obtain baseline x-rays today.  Plan: XR Foot 2 Views Right, XR Foot 2 Views Left.  X-ray showed early osteoarthritic changes.  Positive ANA (antinuclear antibody) - 01/03/21: ANA 1:160 speckled, ESR 25, RF 336.3 -she has positive ANA.  There is no history of oral ulcers, nasal ulcers, malar rash, full sensitivity, Raynaud's phenomenon, lymphadenopathy or sicca  symptoms.  I will obtain following labs.  Plan: RNP Antibody, Anti-Smith antibody, Sjogrens syndrome-A extractable nuclear antibody, Anti-DNA antibody, double-stranded, C3 and C4  Other fatigue -she gives history of fatigue due to chronic pain and insomnia due to pain.  Plan: CK, TSH  Primary hypertension-blood pressure is elevated today.  Patient states she was under a lot of stress as she was driving from [place].  Her blood pressure is usually normal.  Other medical problems are listed as follows:  H/O hypercholesterolemia  History of gastroesophageal reflux (GERD)  Liver mass -I reviewed her previous CT scan.  CT scan in 2016 showed benign liver lesion .  Kidney stone - Several years ago.  History of anxiety  Other insomnia  History of sleep apnea  Family history of rheumatoid arthritis in mother  Orders: Orders Placed This Encounter  Procedures   XR Hand 2 View Right   XR Hand 2 View Left   XR Foot 2 Views Right   XR Foot 2 Views Left   CBC with Differential/Platelet   COMPLETE METABOLIC PANEL WITH GFR   Sedimentation rate   CK   TSH   Cyclic citrul peptide antibody, IgG   Hepatitis B core antibody, IgM   Hepatitis B surface antigen   Hepatitis C antibody   QuantiFERON-TB Gold Plus   Serum protein electrophoresis with reflex   IgG, IgA, IgM   RNP Antibody   Anti-Smith antibody   Sjogrens syndrome-A extractable nuclear antibody   Anti-DNA antibody, double-stranded   C3 and C4   CBC with Differential/Platelet   COMPLETE METABOLIC PANEL WITH GFR   Meds ordered this  encounter  Medications   predniSONE (DELTASONE) 5 MG tablet    Sig: Take 58m in morning x 7 days, then 154mx 7 days, then 1057m 7 days, then 5 mg daily x 7 days.    Dispense:  70 tablet    Refill:  0     Follow-Up Instructions: Return for Rheumatoid arthritis.   ShaBo MerinoD  Note - This record has been created using DraEditor, commissioningChart creation errors have been sought,  but may not always  have been located. Such creation errors do not reflect on  the standard of medical care.

## 2021-04-26 ENCOUNTER — Ambulatory Visit: Payer: Self-pay

## 2021-04-26 ENCOUNTER — Ambulatory Visit (INDEPENDENT_AMBULATORY_CARE_PROVIDER_SITE_OTHER): Payer: Medicare Other | Admitting: Rheumatology

## 2021-04-26 ENCOUNTER — Other Ambulatory Visit: Payer: Self-pay

## 2021-04-26 ENCOUNTER — Encounter: Payer: Self-pay | Admitting: Rheumatology

## 2021-04-26 VITALS — BP 168/96 | HR 76 | Resp 15 | Ht 59.0 in | Wt 128.0 lb

## 2021-04-26 DIAGNOSIS — M79672 Pain in left foot: Secondary | ICD-10-CM | POA: Diagnosis not present

## 2021-04-26 DIAGNOSIS — M79671 Pain in right foot: Secondary | ICD-10-CM

## 2021-04-26 DIAGNOSIS — R768 Other specified abnormal immunological findings in serum: Secondary | ICD-10-CM

## 2021-04-26 DIAGNOSIS — M79642 Pain in left hand: Secondary | ICD-10-CM

## 2021-04-26 DIAGNOSIS — G8929 Other chronic pain: Secondary | ICD-10-CM

## 2021-04-26 DIAGNOSIS — Z8659 Personal history of other mental and behavioral disorders: Secondary | ICD-10-CM

## 2021-04-26 DIAGNOSIS — G4709 Other insomnia: Secondary | ICD-10-CM

## 2021-04-26 DIAGNOSIS — M0579 Rheumatoid arthritis with rheumatoid factor of multiple sites without organ or systems involvement: Secondary | ICD-10-CM

## 2021-04-26 DIAGNOSIS — M25512 Pain in left shoulder: Secondary | ICD-10-CM

## 2021-04-26 DIAGNOSIS — Z79899 Other long term (current) drug therapy: Secondary | ICD-10-CM

## 2021-04-26 DIAGNOSIS — Z8639 Personal history of other endocrine, nutritional and metabolic disease: Secondary | ICD-10-CM

## 2021-04-26 DIAGNOSIS — M2550 Pain in unspecified joint: Secondary | ICD-10-CM | POA: Insufficient documentation

## 2021-04-26 DIAGNOSIS — Z8719 Personal history of other diseases of the digestive system: Secondary | ICD-10-CM

## 2021-04-26 DIAGNOSIS — R160 Hepatomegaly, not elsewhere classified: Secondary | ICD-10-CM

## 2021-04-26 DIAGNOSIS — N200 Calculus of kidney: Secondary | ICD-10-CM

## 2021-04-26 DIAGNOSIS — M79641 Pain in right hand: Secondary | ICD-10-CM

## 2021-04-26 DIAGNOSIS — I10 Essential (primary) hypertension: Secondary | ICD-10-CM

## 2021-04-26 DIAGNOSIS — R5383 Other fatigue: Secondary | ICD-10-CM

## 2021-04-26 DIAGNOSIS — Z8669 Personal history of other diseases of the nervous system and sense organs: Secondary | ICD-10-CM

## 2021-04-26 DIAGNOSIS — Z8261 Family history of arthritis: Secondary | ICD-10-CM

## 2021-04-26 MED ORDER — PREDNISONE 5 MG PO TABS: ORAL_TABLET | ORAL | 0 refills | Status: DC

## 2021-04-26 NOTE — Progress Notes (Addendum)
Pharmacy Note  Subjective: Patient presents today to [facility] for follow up office visit. Patient seen by the pharmacist for counseling on methotrexate for rheumatoid arthritis.   Objective: CBC    Component Value Date/Time   WBC 3.0 (L) 10/02/2014 1951   RBC 4.22 10/02/2014 1951   HGB 12.7 10/02/2014 1951   HCT 38.2 10/02/2014 1951   PLT 211 10/02/2014 1951   MCV 90.5 10/02/2014 1951   MCH 30.0 10/02/2014 1951   MCHC 33.2 10/02/2014 1951   RDW 12.4 10/02/2014 1951    CMP     Component Value Date/Time   NA 139 10/02/2014 1951   K 3.9 10/02/2014 1951   CL 102 10/02/2014 1951   CO2 30 10/02/2014 1951   GLUCOSE 89 10/02/2014 1951   BUN 15 10/02/2014 1951   CREATININE 0.68 10/02/2014 1951   CALCIUM 9.0 10/02/2014 1951   PROT 7.5 10/02/2014 1951   ALBUMIN 4.2 10/02/2014 1951   AST 41 10/02/2014 1951   ALT 39 10/02/2014 1951   ALKPHOS 107 10/02/2014 1951   BILITOT 0.5 10/02/2014 1951   GFRNONAA >60 10/02/2014 1951   GFRAA >60 10/02/2014 1951    Baseline Immunosuppressant Therapy Labs TB GOLD   Hepatitis Panel   HIV No results found for: HIV Immunoglobulins   SPEP Serum Protein Electrophoresis Latest Ref Rng & Units 10/02/2014  Total Protein 6.5 - 8.1 g/dL 7.5    Chest-xray:  02/24/2019 -no acute disease    Contraception: post-menopausal  Alcohol use: infrequent, occasionally has glass of wine  Assessment/Plan:   Patient was counseled on the purpose, proper use, and adverse effects of methotrexate including nausea, infection, and signs and symptoms of pneumonitis. Reviewed that if she has recurrent or severe nausea, we could pursue injectable. She was more hesitant to start injections but would consider as an option if autoinjector was approved. (Reviewed that most insurance plans require she try oral methotrexate before autoinjector is approved). Discussed that there is the possibility of an increased risk of malignancy, specifically lymphomas,  but it is not well understood if this increased risk is due to the medication or the disease state.  Instructed patient that medication should be held for infection and prior to surgery. Reviewed less frequent risk of mouth sores - advised patient to notify us if she develops as patient has braces right now.  Advised patient to avoid live vaccines. Recommend annual influenza, Pneumovax 23, Prevnar 13, and Shingrix as indicated.  Patient states she does not receive flu shot d/t side effects of flu-like symptoms after vaccine. I advised that this is common but does not mean she got flu from vaccine. Advised that our recommendation is that she be UTD since methotrexate can increase risk for these infections and vaccines are considered primary preventative strategy for these illnesses.  Reviewed instructions with patient to take methotrexate weekly along with folic acid daily.  Discussed the importance of frequent monitoring of kidney and liver function and blood counts, and provided patient with standing lab instructions.  Counseled patient to avoid NSAIDs and alcohol while on methotrexate.  Provided patient with educational materials on methotrexate and answered all questions.   Patient voiced understanding.  Patient consented to methotrexate use.  Will upload into chart.    Dose of methotrexate will be 15mg  weekly x [redacted] weeks along with folic acid 2mg  daily, then increase to 20mg  weekly x 2 weeks.  Will update labs in 1 month then monitor labs every 3 months. Prescription pending baseline  labs.  Rx for prednisone taper sent to [facility]. Dose is: 20mg  daily x 7 days, 15mg  daily x 7 days 10mg  daily x 7 days, then 5mg  daily x 7 days  Knox Saliva, PharmD, MPH, BCPS Clinical Pharmacist (Rheumatology and Pulmonology)

## 2021-04-26 NOTE — Patient Instructions (Signed)
Methotrexate Tablets What is this medication? METHOTREXATE (METH oh TREX ate) treats inflammatory conditions such as arthritis and psoriasis. It works by decreasing inflammation, which can reduce pain and prevent long-term injury to the joints and skin. It may also be used to treat some types of cancer. It works by slowing down the growth of cancer cells. This medicine may be used for other purposes; ask your health care provider or pharmacist if you have questions. COMMON BRAND NAME(S): Rheumatrex, Trexall What should I tell my care team before I take this medication? They need to know if you have any of these conditions: Fluid in the stomach area or lungs If you often drink alcohol Infection or immune system problems Kidney disease or on hemodialysis Liver disease Low blood counts, like low white cell, platelet, or red cell counts Lung disease Radiation therapy Stomach ulcers Ulcerative colitis An unusual or allergic reaction to methotrexate, other medications, foods, dyes, or preservatives Pregnant or trying to get pregnant Breast-feeding How should I use this medication? Take this medication by mouth with a glass of water. Follow the directions on the prescription label. Take your medication at regular intervals. Do not take it more often than directed. Do not stop taking except on your care team's advice. Make sure you know why you are taking this medication and how often you should take it. If this medication is used for a condition that is not cancer, like arthritis or psoriasis, it should be taken weekly, NOT daily. Taking this medication more often than directed can cause serious side effects, even death. Talk to your care team about safe handling and disposal of this medication. You may need to take special precautions. Talk to your care team about the use of this medication in children. While this medication may be prescribed for selected conditions, precautions do  apply. Overdosage: If you think you have taken too much of this medicine contact a poison control center or emergency room at once. NOTE: This medicine is only for you. Do not share this medicine with others. What if I miss a dose? If you miss a dose, talk with your care team. Do not take double or extra doses. What may interact with this medication? Do not take this medication with any of the following: Acitretin This medication may also interact with the following: Aspirin and aspirin-like medications including salicylates Azathioprine Certain antibiotics like penicillins, tetracycline, and chloramphenicol Certain medications that treat or prevent blood clots like warfarin, apixaban, dabigatran, and rivaroxaban Certain medications for stomach problems like esomeprazole, omeprazole, pantoprazole Cyclosporine Dapsone Diuretics Gold Hydroxychloroquine Live virus vaccines Medications for infection like acyclovir, adefovir, amphotericin B, bacitracin, cidofovir, foscarnet, ganciclovir, gentamicin, pentamidine, vancomycin Mercaptopurine NSAIDs, medications for pain and inflammation, like ibuprofen or naproxen Other cytotoxic agents Pamidronate Pemetrexed Penicillamine Phenylbutazone Phenytoin Probenecid Pyrimethamine Retinoids such as isotretinoin and tretinoin Steroid medications like prednisone or cortisone Sulfonamides like sulfasalazine and trimethoprim/sulfamethoxazole Theophylline Zoledronic acid This list may not describe all possible interactions. Give your health care provider a list of all the medicines, herbs, non-prescription drugs, or dietary supplements you use. Also tell them if you smoke, drink alcohol, or use illegal drugs. Some items may interact with your medicine. What should I watch for while using this medication? Avoid alcoholic drinks. This medication can make you more sensitive to the sun. Keep out of the sun. If you cannot avoid being in the sun, wear  protective clothing and use sunscreen. Do not use sun lamps or tanning beds/booths. You may need  blood work done while you are taking this medication. Call your care team for advice if you get a fever, chills or sore throat, or other symptoms of a cold or flu. Do not treat yourself. This medication decreases your body's ability to fight infections. Try to avoid being around people who are sick. This medication may increase your risk to bruise or bleed. Call your care team if you notice any unusual bleeding. Be careful brushing or flossing your teeth or using a toothpick because you may get an infection or bleed more easily. If you have any dental work done, tell your dentist you are receiving this medication. Check with your care team if you get an attack of severe diarrhea, nausea and vomiting, or if you sweat a lot. The loss of too much body fluid can make it dangerous for you to take this medication. Talk to your care team about your risk of cancer. You may be more at risk for certain types of cancers if you take this medication. Do not become pregnant while taking this medication or for 6 months after stopping it. Women should inform their care team if they wish to become pregnant or think they might be pregnant. Men should not father a child while taking this medication and for 3 months after stopping it. There is potential for serious harm to an unborn child. Talk to your care team for more information. Do not breast-feed an infant while taking this medication or for 1 week after stopping it. This medication may make it more difficult to get pregnant or father a child. Talk to your care team if you are concerned about your fertility. What side effects may I notice from receiving this medication? Side effects that you should report to your care team as soon as possible: Allergic reactions--skin rash, itching, hives, swelling of the face, lips, tongue, or throat Blood clot--pain, swelling, or warmth  in the leg, shortness of breath, chest pain Dry cough, shortness of breath or trouble breathing Infection--fever, chills, cough, sore throat, wounds that don't heal, pain or trouble when passing urine, general feeling of discomfort or being unwell Kidney injury--decrease in the amount of urine, swelling of the ankles, hands, or feet Liver injury--right upper belly pain, loss of appetite, nausea, light-colored stool, dark yellow or brown urine, yellowing of the skin or eyes, unusual weakness or fatigue Low red blood cell count--unusual weakness or fatigue, dizziness, headache, trouble breathing Redness, blistering, peeling, or loosening of the skin, including inside the mouth Seizures Unusual bruising or bleeding Side effects that usually do not require medical attention (report to your care team if they continue or are bothersome): Diarrhea Dizziness Hair loss Nausea Pain, redness, or swelling with sores inside the mouth or throat Vomiting This list may not describe all possible side effects. Call your doctor for medical advice about side effects. You may report side effects to FDA at 1-800-FDA-1088. Where should I keep my medication? Keep out of the reach of children and pets. Store at room temperature between 20 and 25 degrees C (68 and 77 degrees F). Protect from light. Get rid of any unused medication after the expiration date. Talk to your care team about how to dispose of unused medication. Special directions may apply.  Standing Labs We placed an order today for your standing lab work.   Please have your standing labs drawn in 2 weeks x 2 after starting methotrexate  If possible, please have your labs drawn 2 weeks prior to your  appointment so that the provider can discuss your results at your appointment.  Please note that you may see your imaging and lab results in [place] before we have reviewed them. We may be awaiting multiple results to interpret others before contacting  you. Please allow our office up to 72 hours to thoroughly review all of the results before contacting the office for clarification of your results.  We have open lab daily: Monday through Thursday from 1:30-4:30 PM and Friday from 1:30-4:00 PM at the office of Dr. Bo Merino, [facility].   Please be advised, all patients with office appointments requiring lab work will take precedent over walk-in lab work.  If possible, please come for your lab work on Monday and Friday afternoons, as you may experience shorter wait times. The office is located at [address], [place], [place], [place] [address] No appointment is necessary.   Labs are drawn by [facility]. Please bring your co-pay at the time of your lab draw.  You may receive a bill from [place] for your lab work.  Please note if you are on Hydroxychloroquine and and an order has been placed for a Hydroxychloroquine level, you will need to have it drawn 4 hours or more after your last dose.  If you wish to have your labs drawn at another location, please call the office 24 hours in advance to send orders.  If you have any questions regarding directions or hours of operation,  please call [phone].   As a reminder, please drink plenty of water prior to coming for your lab work. Thanks!  NOTE: This sheet is a summary. It may not cover all possible information. If you have questions about this medicine, talk to your doctor, pharmacist, or health care provider.  2022 Elsevier/Gold Standard (2020-04-19 00:00:00) Vaccines You are taking a medication(s) that can suppress your immune system.  The following immunizations are recommended: Flu annually Covid-19  Td/Tdap (tetanus, diphtheria, pertussis) every 10 years Pneumonia (Prevnar 15 then Pneumovax 23 at least 1 year apart.  Alternatively, can take Prevnar 20 without needing additional dose) Shingrix: 2 doses from 4 weeks to 6 months apart  Please check with your PCP to  make sure you are up to date.   If you have signs or symptoms of an infection or start antibiotics: First, call your PCP for workup of your infection. Hold your medication through the infection, until you complete your antibiotics, and until symptoms resolve if you take the following: Injectable medication (Actemra, Benlysta, Cimzia, Cosentyx, Enbrel, Humira, Kevzara, Orencia, Remicade, Simponi, Stelara, Taltz, Tremfya) Methotrexate Leflunomide (Arava) Mycophenolate (Cellcept) Morrie Sheldon, Olumiant, or Rinvoq

## 2021-04-27 NOTE — Progress Notes (Signed)
Please and aldolase if possible

## 2021-05-01 NOTE — Progress Notes (Signed)
White cell count is low which is probably baseline for the patient.  CMP is normal.  Sed rate is normal.  TSH, hepatitis panel, TB Gold, immunoglobulins are normal, autoimmune antibodies which include Smith, RNP, double-stranded DNA were negative, complements were normal.  Anti-CCP which is specific for rheumatoid arthritis is positive.  Please send the prescription for methotrexate 6 tablets p.o. weekly along with folic acid 2 mg p.o. daily as discussed during the last office visit.  Patient will have labs in 2 weeks x 2 to increase the dose of methotrexate to 8 tablets p.o. weekly if labs are stable.

## 2021-05-02 LAB — HEPATITIS C ANTIBODY
Hepatitis C Ab: NONREACTIVE
SIGNAL TO CUT-OFF: 0.07 (ref <1.00)

## 2021-05-02 LAB — SEDIMENTATION RATE: Sed Rate: 14 mm/h (ref 0–30)

## 2021-05-02 LAB — CBC WITH DIFFERENTIAL/PLATELET
Absolute Monocytes: 313 cells/uL (ref 200–950)
Basophils Absolute: 10 cells/uL (ref 0–200)
Basophils Relative: 0.3 %
Eosinophils Absolute: 31 cells/uL (ref 15–500)
Eosinophils Relative: 0.9 %
HCT: 38.6 % (ref 35.0–45.0)
Hemoglobin: 12.5 g/dL (ref 11.7–15.5)
Lymphs Abs: 1261 cells/uL (ref 850–3900)
MCH: 29.6 pg (ref 27.0–33.0)
MCHC: 32.4 g/dL (ref 32.0–36.0)
MCV: 91.3 fL (ref 80.0–100.0)
MPV: 11.4 fL (ref 7.5–12.5)
Monocytes Relative: 9.2 %
Neutro Abs: 1785 cells/uL (ref 1500–7800)
Neutrophils Relative %: 52.5 %
Platelets: 275 10*3/uL (ref 140–400)
RBC: 4.23 10*6/uL (ref 3.80–5.10)
RDW: 11.1 % (ref 11.0–15.0)
Total Lymphocyte: 37.1 %
WBC: 3.4 10*3/uL — ABNORMAL LOW (ref 3.8–10.8)

## 2021-05-02 LAB — COMPLETE METABOLIC PANEL WITH GFR
AG Ratio: 1.5 (calc) (ref 1.0–2.5)
ALT: 10 U/L (ref 6–29)
AST: 22 U/L (ref 10–35)
Albumin: 4.3 g/dL (ref 3.6–5.1)
Alkaline phosphatase (APISO): 124 U/L (ref 37–153)
BUN: 18 mg/dL (ref 7–25)
CO2: 25 mmol/L (ref 20–32)
Calcium: 9.5 mg/dL (ref 8.6–10.4)
Chloride: 106 mmol/L (ref 98–110)
Creat: 0.65 mg/dL (ref 0.50–1.03)
Globulin: 2.8 g/dL (calc) (ref 1.9–3.7)
Glucose, Bld: 79 mg/dL (ref 65–99)
Potassium: 4 mmol/L (ref 3.5–5.3)
Sodium: 141 mmol/L (ref 135–146)
Total Bilirubin: 0.8 mg/dL (ref 0.2–1.2)
Total Protein: 7.1 g/dL (ref 6.1–8.1)
eGFR: 106 mL/min/{1.73_m2} (ref >=60)

## 2021-05-02 LAB — TEST AUTHORIZATION

## 2021-05-02 LAB — QUANTIFERON-TB GOLD PLUS
Mitogen-NIL: >10 IU/mL
NIL: 0.05 IU/mL
QuantiFERON-TB Gold Plus: NEGATIVE
TB1-NIL: 0.19 IU/mL
TB2-NIL: 0.12 IU/mL

## 2021-05-02 LAB — SJOGRENS SYNDROME-A EXTRACTABLE NUCLEAR ANTIBODY: SSA (Ro) (ENA) Antibody, IgG: <1 AI

## 2021-05-02 LAB — PROTEIN ELECTROPHORESIS, SERUM, WITH REFLEX
Albumin ELP: 4.2 g/dL (ref 3.8–4.8)
Alpha 1: 0.3 g/dL (ref 0.2–0.3)
Alpha 2: 0.7 g/dL (ref 0.5–0.9)
Beta 2: 0.4 g/dL (ref 0.2–0.5)
Beta Globulin: 0.4 g/dL (ref 0.4–0.6)
Gamma Globulin: 1.2 g/dL (ref 0.8–1.7)
Total Protein: 7 g/dL (ref 6.1–8.1)

## 2021-05-02 LAB — IGG, IGA, IGM
IgG (Immunoglobin G), Serum: 1230 mg/dL (ref 600–1640)
IgM, Serum: 126 mg/dL (ref 50–300)
Immunoglobulin A: 274 mg/dL (ref 47–310)

## 2021-05-02 LAB — RNP ANTIBODY: Ribonucleic Protein(ENA) Antibody, IgG: <1 AI

## 2021-05-02 LAB — ANTI-SMITH ANTIBODY: ENA SM Ab Ser-aCnc: <1 AI

## 2021-05-02 LAB — C3 AND C4
C3 Complement: 135 mg/dL (ref 83–193)
C4 Complement: 38 mg/dL (ref 15–57)

## 2021-05-02 LAB — HEPATITIS B SURFACE ANTIGEN: Hepatitis B Surface Ag: NONREACTIVE

## 2021-05-02 LAB — ANTI-DNA ANTIBODY, DOUBLE-STRANDED: ds DNA Ab: 1 IU/mL

## 2021-05-02 LAB — ALDOLASE: Aldolase: 4.4 U/L (ref <=8.1)

## 2021-05-02 LAB — CYCLIC CITRUL PEPTIDE ANTIBODY, IGG: Cyclic Citrullin Peptide Ab: >250 UNITS — ABNORMAL HIGH

## 2021-05-02 LAB — HEPATITIS B CORE ANTIBODY, IGM: Hep B C IgM: NONREACTIVE

## 2021-05-02 LAB — TSH: TSH: 1.08 mIU/L

## 2021-05-02 LAB — CK: Total CK: 206 U/L — ABNORMAL HIGH (ref 29–143)

## 2021-05-03 NOTE — Progress Notes (Signed)
No show for appointment. Office will call to reschedule.  

## 2021-05-03 NOTE — Progress Notes (Signed)
Aldolase is normal.

## 2021-05-04 ENCOUNTER — Ambulatory Visit: Payer: Medicare Other | Admitting: Internal Medicine

## 2021-05-04 NOTE — Progress Notes (Signed)
We can switch her to methotrexate subcutaneous injections.  She will need a nurse visit or a pharmacy visit for education.

## 2021-05-26 ENCOUNTER — Ambulatory Visit: Payer: Medicare Other | Admitting: Rheumatology

## 2021-06-07 ENCOUNTER — Ambulatory Visit: Payer: Medicare Other | Admitting: Internal Medicine

## 2021-06-07 NOTE — Progress Notes (Signed)
No show for appointment. Office will call to reschedule.  

## 2021-06-08 ENCOUNTER — Telehealth: Payer: Self-pay | Admitting: Pharmacist

## 2021-06-08 ENCOUNTER — Ambulatory Visit: Payer: Medicare Other | Admitting: Rheumatology

## 2021-06-08 ENCOUNTER — Other Ambulatory Visit: Payer: Self-pay | Admitting: *Deleted

## 2021-06-08 ENCOUNTER — Other Ambulatory Visit: Payer: Self-pay | Admitting: Obstetrics & Gynecology

## 2021-06-08 DIAGNOSIS — Z1231 Encounter for screening mammogram for malignant neoplasm of breast: Secondary | ICD-10-CM

## 2021-06-08 DIAGNOSIS — M0579 Rheumatoid arthritis with rheumatoid factor of multiple sites without organ or systems involvement: Secondary | ICD-10-CM

## 2021-06-08 DIAGNOSIS — M79641 Pain in right hand: Secondary | ICD-10-CM

## 2021-06-08 MED ORDER — PREDNISONE 5 MG PO TABS: ORAL_TABLET | ORAL | 0 refills | Status: DC

## 2021-06-08 NOTE — Telephone Encounter (Signed)
Patient requesting refill, patient stated she is having bil hand pain and swelling. ? ?Next Visit: 07/05/2021 ? ?Last Visit: 04/26/2021 ? ?Last Fill:04/26/2021 ? ?Dx: Rheumatoid arthritis with rheumatoid factor of multiple sites without organ or systems involvement ? ?Current Dose per office note on 04/26/2021: prednisone 20 mg p.o. daily for 1 week and then taper by 5 mg every week ? ?Okay to refill prednisone?  ? ?

## 2021-06-08 NOTE — Telephone Encounter (Signed)
Patient left VM stating she requested an appt for 07/04/21 in morning but appt was scheduled for 06/28/21. She states that she is at work right now and cannot schedule it at this time. She requests call back at 11am. ? ?Routing to scheduling team to reach out to patient to have pt r/s for 07/04/21 morning slot for f/u with Dr. Estanislado Pandy and will need to train on MTX injection at the same visit. ? ?She was only seen once by Dr. Estanislado Pandy on 04/26/21 ? ?Knox Saliva, PharmD, MPH, BCPS ?Clinical Pharmacist (Rheumatology and Pulmonology) ?

## 2021-06-21 NOTE — Progress Notes (Signed)
? ?Office Visit Note ? ?Patient: [name]             ?Date of Birth: 1970/03/21           ?MRN: [id number]             ?PCP: Lorelee Market, MD ?Referring: Lorelee Market, MD ?Visit Date: 07/05/2021 ?Occupation: _0 @ ? ?Subjective:  ?Medication management ? ?History of Present Illness: [name] is a 51 y.o. female with history of seropositive rheumatoid arthritis.  She returns today after her initial visit on April 26, 2021.  She states she is feeling better since she has taken prednisone taper.  She was given a prednisone taper on June 08, 2021 starting at prednisone 20 mg p.o. daily and taper by 5 mg every 7 days.  She still continues to have some discomfort in her joints.  She reviewed the information on methotrexate.  She wants to start on subcutaneous methotrexate. ? ?Activities of Daily Living:  ?Patient reports morning stiffness for 0 minutes.   ?Patient Denies nocturnal pain.  ?Difficulty dressing/grooming: Denies ?Difficulty climbing stairs: Denies ?Difficulty getting out of chair: Denies ?Difficulty using hands for taps, buttons, cutlery, and/or writing: Denies ? ?Review of Systems  ?Constitutional:  Negative for fatigue.  ?HENT:  Negative for mouth sores, mouth dryness and nose dryness.   ?Eyes:  Negative for pain, itching and dryness.  ?Respiratory:  Negative for shortness of breath and difficulty breathing.   ?Cardiovascular:  Negative for chest pain and palpitations.  ?Gastrointestinal:  Negative for blood in stool, constipation and diarrhea.  ?Endocrine: Negative for increased urination.  ?Genitourinary:  Negative for difficulty urinating.  ?Musculoskeletal:  Negative for joint pain, joint pain, joint swelling, myalgias, morning stiffness, muscle tenderness and myalgias.  ?Skin:  Negative for color change, rash and redness.  ?Allergic/Immunologic: Negative for susceptible to infections.  ?Neurological:  Negative for dizziness, numbness, headaches, memory loss and  weakness.  ?Hematological:  Negative for bruising/bleeding tendency.  ?Psychiatric/Behavioral:  Negative for confusion.   ? ?PMFS History:  ?Patient Active Problem List  ? Diagnosis Date Noted  ? Polyarthralgia 04/26/2021  ? Kidney stone 05/06/2012  ? Dense breasts 02/05/2012  ? Anemia   ? Anxiety   ? Arthritis   ? Hypertension   ? Liver mass   ? Ovarian cyst   ? Insomnia   ? H/O hypercholesterolemia   ? GERD (gastroesophageal reflux disease) 11/29/2010  ? 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  ?Past Medical History:  ?Diagnosis Date  ? Anemia   ? Anxiety   ? Arthritis   ? lower back  ? Depression   ? Endocervical polyp   ? h/o  ? Fibroadenoma of breast   ? left breast  ? Fibroids   ? h/o  ? GERD (gastroesophageal reflux disease)   ? on nexium-instructed to take dos  ? H/O hypercholesterolemia   ? History of right oophorectomy   ? Hypertension   ? controlled on hctz-b/p 142/93 at pat visit-will do ekg at pat visit  ? Insomnia   ? Liver mass 12/2007  ? benign- per pt " no longer there"  ? Ovarian cyst   ? h/o  ?  ?Family History  ?Problem Relation Age of Onset  ? Rheum arthritis Mother   ? Pancreatic cancer Maternal Grandmother   ? ?Past Surgical History:  ?Procedure Laterality Date  ? ABDOMINAL HYSTERECTOMY  11/29/2010  ? BILATERAL SALPINGECTOMY Bilateral 06/12/2012  ? Procedure: BILATERAL SALPINGECTOMY;  Surgeon: Eldred Manges,  MD;  Location: [facility];  Service: Gynecology;  Laterality: Bilateral;  ? COLON SURGERY    ? [place]  ? LAPAROSCOPIC ASSISTED VAGINAL HYSTERECTOMY  11/29/2010  ? Procedure: LAPAROSCOPIC ASSISTED VAGINAL HYSTERECTOMY;  Surgeon: Eldred Manges, MD;  Location: [facility];  Service: Gynecology;  Laterality: N/A;  ? LAPAROSCOPY N/A 06/12/2012  ? Procedure: LAPAROSCOPY OPERATIVE;  Surgeon: Eldred Manges, MD;  Location: [facility];  Service: Gynecology;  Laterality: N/A;  ? MYOMECTOMY  2006  ? ?Social History  ? ?Social History Narrative  ? Not on file  ? ?Immunization History   ?Administered Date(s) Administered  ? PFIZER(Purple Top)SARS-COV-2 Vaccination 01/02/2020, 01/23/2020  ?  ? ?Objective: ?Vital Signs: BP (!) 164/93 (BP Location: Left Arm, Patient Position: Sitting, Cuff Size: Normal)   Pulse 73   Ht _0  (1.549 m)   Wt 140 lb 6.4 oz (63.7 kg)   LMP 11/03/2010   BMI 26.53 kg/m?   ? ?Physical Exam ?Vitals and nursing note reviewed.  ?Constitutional:   ?   Appearance: She is well-developed.  ?HENT:  ?   Head: Normocephalic and atraumatic.  ?Eyes:  ?   Conjunctiva/sclera: Conjunctivae normal.  ?Cardiovascular:  ?   Rate and Rhythm: Normal rate and regular rhythm.  ?   Heart sounds: Normal heart sounds.  ?Pulmonary:  ?   Effort: Pulmonary effort is normal.  ?   Breath sounds: Normal breath sounds.  ?Abdominal:  ?   General: Bowel sounds are normal.  ?   Palpations: Abdomen is soft.  ?Musculoskeletal:  ?   Cervical back: Normal range of motion.  ?Lymphadenopathy:  ?   Cervical: No cervical adenopathy.  ?Skin: ?   General: Skin is warm and dry.  ?   Capillary Refill: Capillary refill takes less than 2 seconds.  ?Neurological:  ?   Mental Status: She is alert and oriented to person, place, and time.  ?Psychiatric:     ?   Behavior: Behavior normal.  ?  ? ?Musculoskeletal Exam: C-spine was in good range of motion.  Shoulder joints, elbow joints, wrist joints, MCPs PIPs and DIPs and good range of motion with no synovitis.  Hip joints, knee joints, ankles, MTPs and PIPs with good range of motion with no synovitis. ? ?CDAI Exam: ?CDAI Score: 0.2  ?Patient Global: 1 mm; Provider Global: 1 mm ?Swollen: 0 ; Tender: 0  ?Joint Exam 07/05/2021  ? ?No joint exam has been documented for this visit  ? ?There is currently no information documented on the homunculus. Go to the Rheumatology activity and complete the homunculus joint exam. ? ?Investigation: ?No additional findings. ? ?Imaging: ?No results found. ? ?Recent Labs: ?Lab Results  ?Component Value Date  ? WBC 3.4 (L) 04/26/2021  ? HGB  12.5 04/26/2021  ? PLT 275 04/26/2021  ? NA 141 04/26/2021  ? K 4.0 04/26/2021  ? CL 106 04/26/2021  ? CO2 25 04/26/2021  ? GLUCOSE 79 04/26/2021  ? BUN 18 04/26/2021  ? CREATININE 0.65 04/26/2021  ? BILITOT 0.8 04/26/2021  ? ALKPHOS 107 10/02/2014  ? AST 22 04/26/2021  ? ALT 10 04/26/2021  ? PROT 7.1 04/26/2021  ? PROT 7.0 04/26/2021  ? ALBUMIN 4.2 10/02/2014  ? CALCIUM 9.5 04/26/2021  ? GFRAA >60 10/02/2014  ? QFTBGOLDPLUS NEGATIVE 04/26/2021  ? ?April 26, 2021 SPEP negative, immunoglobulins normal, TB Gold negative, hepatitis B-, hepatitis C negative,CK206, aldolase 4.4, TSH 1.08, ESR 14, (double-stranded DNA, SSA, Smith, RNP negative), C3-C4  normal ,anti-CCP >250 ? ? ?Speciality Comments: No specialty comments available. ? ?Procedures:  ?No procedures performed ?Allergies: Oxycodone-acetaminophen, Sulfa antibiotics, Tetracyclines & related, Hydrocodone, Oxycodone-acetaminophen, Shellfish allergy, and Tetracycline  ? ?Assessment / Plan:     ?Visit Diagnoses: Rheumatoid arthritis with rheumatoid factor and anti-CCP antibody -positive RF, positive anti-CCP, elevated sed rate and synovitis.  Prednisone taper was given at the last visit as a bridging therapy before starting methotrexate.  Patient was on prednisone taper which she finished yesterday.  She had no synovitis on the examination.  We had detailed discussion regarding methotrexate today.  Indications side effects contraindications were discussed at length.  Patient prefers to take subcutaneous methotrexate as she does not tolerate oral medications.  She also has history of gastritis.  After indications side effects contraindications were discussed and handout was given and consent was taken.  She was started on methotrexate subcutaneous 0.6 mL weekly for 2 weeks.  If labs are normal then she will increase the dose to 0.8 mL weekly.  She was given a prescription for folic acid 2 mg p.o. daily.  She was advised to get labs in 2 weeks x 2 and then every 3  months to monitor for drug toxicity. ? ?Drug Counseling ?TB Gold: February 2023 ?Hepatitis panel: February 2023 ? ?Chest-xray: November 2009 ? ?Contraception: Postmenopausal ? ?Alcohol use: none ? ?Patient was couns

## 2021-06-28 ENCOUNTER — Ambulatory Visit: Payer: Medicare Other | Admitting: Rheumatology

## 2021-07-05 ENCOUNTER — Ambulatory Visit (INDEPENDENT_AMBULATORY_CARE_PROVIDER_SITE_OTHER): Payer: Medicare Other | Admitting: Rheumatology

## 2021-07-05 ENCOUNTER — Encounter: Payer: Self-pay | Admitting: Rheumatology

## 2021-07-05 VITALS — BP 164/93 | HR 73 | Ht 61.0 in | Wt 140.4 lb

## 2021-07-05 DIAGNOSIS — M79672 Pain in left foot: Secondary | ICD-10-CM

## 2021-07-05 DIAGNOSIS — Z8639 Personal history of other endocrine, nutritional and metabolic disease: Secondary | ICD-10-CM

## 2021-07-05 DIAGNOSIS — N200 Calculus of kidney: Secondary | ICD-10-CM

## 2021-07-05 DIAGNOSIS — R768 Other specified abnormal immunological findings in serum: Secondary | ICD-10-CM

## 2021-07-05 DIAGNOSIS — R160 Hepatomegaly, not elsewhere classified: Secondary | ICD-10-CM

## 2021-07-05 DIAGNOSIS — M25512 Pain in left shoulder: Secondary | ICD-10-CM

## 2021-07-05 DIAGNOSIS — Z8669 Personal history of other diseases of the nervous system and sense organs: Secondary | ICD-10-CM

## 2021-07-05 DIAGNOSIS — M79641 Pain in right hand: Secondary | ICD-10-CM

## 2021-07-05 DIAGNOSIS — R748 Abnormal levels of other serum enzymes: Secondary | ICD-10-CM

## 2021-07-05 DIAGNOSIS — M0579 Rheumatoid arthritis with rheumatoid factor of multiple sites without organ or systems involvement: Secondary | ICD-10-CM

## 2021-07-05 DIAGNOSIS — Z79899 Other long term (current) drug therapy: Secondary | ICD-10-CM

## 2021-07-05 DIAGNOSIS — G4709 Other insomnia: Secondary | ICD-10-CM

## 2021-07-05 DIAGNOSIS — M79642 Pain in left hand: Secondary | ICD-10-CM

## 2021-07-05 DIAGNOSIS — G8929 Other chronic pain: Secondary | ICD-10-CM

## 2021-07-05 DIAGNOSIS — Z8719 Personal history of other diseases of the digestive system: Secondary | ICD-10-CM

## 2021-07-05 DIAGNOSIS — I10 Essential (primary) hypertension: Secondary | ICD-10-CM

## 2021-07-05 DIAGNOSIS — Z8261 Family history of arthritis: Secondary | ICD-10-CM

## 2021-07-05 DIAGNOSIS — M79671 Pain in right foot: Secondary | ICD-10-CM

## 2021-07-05 DIAGNOSIS — Z8659 Personal history of other mental and behavioral disorders: Secondary | ICD-10-CM

## 2021-07-05 MED ORDER — METHOTREXATE SODIUM CHEMO INJECTION 50 MG/2ML: INTRAMUSCULAR | 0 refills | Status: DC

## 2021-07-05 MED ORDER — FOLIC ACID 1 MG PO TABS: 2.0000 mg | ORAL_TABLET | Freq: Every day | ORAL | 3 refills | Status: DC

## 2021-07-05 MED ORDER — "BD TB SYRINGE 27G X 1/2"" 1 ML MISC": 3 refills | Status: DC

## 2021-07-05 NOTE — Patient Instructions (Addendum)
Methotrexate Injection ?What is this medication? ?METHOTREXATE (METH oh TREX ate) treats inflammatory conditions such as arthritis and psoriasis. It works by decreasing inflammation, which can reduce pain and prevent long-term injury to the joints and skin. It may also be used to treat some types of cancer. It works by slowing down the growth of cancer cells. ?This medicine may be used for other purposes; ask your health care provider or pharmacist if you have questions. ?What should I tell my care team before I take this medication? ?They need to know if you have any of these conditions: ?Fluid in the stomach area or lungs ?If you often drink alcohol ?Infection or immune system problems ?Kidney disease ?Liver disease ?Low blood counts (white cells, platelets, or red blood cells) ?Lung disease ?Recent or ongoing radiation ?Recent or upcoming vaccine ?Stomach ulcers ?Ulcerative colitis ?An unusual or allergic reaction to methotrexate, other medications, foods, dyes, or preservatives ?Pregnant or trying to get pregnant ?Breast-feeding ?How should I use this medication? ?This medication is for infusion into a vein or for injection into muscle or into the spinal fluid (whichever applies). It is usually given in a hospital or clinic setting. ?In rare cases, you might get this medication at home. You will be taught how to give this medication. Use exactly as directed. Take your medication at regular intervals. Do not take your medication more often than directed. ?If this medication is used for arthritis or psoriasis, it should be taken weekly, NOT daily. ?It is important that you put your used needles and syringes in a special sharps container. Do not put them in a trash can. If you do not have a sharps container, call your pharmacist or care team to get one. ?Talk to your care team about the use of this medication in children. While this medication may be prescribed for children as young as 2 years for selected  conditions, precautions do apply. ?Overdosage: If you think you have taken too much of this medicine contact a poison control center or emergency room at once. ?NOTE: This medicine is only for you. Do not share this medicine with others. ?What if I miss a dose? ?It is important not to miss your dose. Call your care team if you are unable to keep an appointment. If you give yourself the medication, and you miss a dose, talk with your care team. Do not take double or extra doses. ?What may interact with this medication? ?Do not take this medication with any of the following: ?Acitretin ?This medication may also interact with the following: ?Aspirin or aspirin-like medications including salicylates ?Azathioprine ?Certain antibiotics like chloramphenicol, penicillin, tetracycline ?Certain medications that treat or prevent blood clots like warfarin, apixaban, dabigatran, and rivaroxaban ?Certain medications for stomach problems like esomeprazole, omeprazole, pantoprazole ?Cyclosporine ?Dapsone ?Diuretics ?Folic acid ?Gold ?Hydroxychloroquine ?Live virus vaccines ?Medications for infection like acyclovir, adefovir, amphotericin B, bacitracin, cidofovir, foscarnet, ganciclovir, gentamicin, pentamidine, vancomycin ?Mercaptopurine ?NSAIDs, medications for pain and inflammation, like ibuprofen or naproxen ?Pamidronate ?Pemetrexed ?Penicillamine ?Phenylbutazone ?Phenytoin ?Probenecid ?Pyrimethamine ?Retinoids such as isotretinoin and tretinoin ?Steroid medications like prednisone or cortisone ?Sulfonamides like sulfasalazine and trimethoprim/sulfamethoxazole ?Theophylline ?Zoledronic acid ?This list may not describe all possible interactions. Give your health care provider a list of all the medicines, herbs, non-prescription drugs, or dietary supplements you use. Also tell them if you smoke, drink alcohol, or use illegal drugs. Some items may interact with your medicine. ?What should I watch for while using this  medication? ?This medication may make you feel  generally unwell. This is not uncommon as chemotherapy can affect healthy cells as well as cancer cells. Report any side effects. Continue your course of treatment even though you feel ill unless your care team tells you to stop. ?Your condition will be monitored carefully while you are receiving this medication. ?Avoid alcoholic drinks. ?This medication can cause serious side effects. To reduce the risk, your care team may give you other medications to take before receiving this one. Be sure to follow the directions from your care team. ?This medication can make you more sensitive to the sun. Keep out of the sun. If you cannot avoid being in the sun, wear protective clothing and use sunscreen. Do not use sun lamps or tanning beds/booths. ?You may get drowsy or dizzy. Do not drive, use machinery, or do anything that needs mental alertness until you know how this medication affects you. Do not stand or sit up quickly, especially if you are an older patient. This reduces the risk of dizzy or fainting spells. ?You may need blood work while you are taking this medication. ?Call your care team for advice if you get a fever, chills or sore throat, or other symptoms of a cold or flu. Do not treat yourself. This medication decreases your body's ability to fight infections. Try to avoid being around people who are sick. ?This medication may increase your risk to bruise or bleed. Call your care team if you notice any unusual bleeding. ?Be careful brushing or flossing your teeth or using a toothpick because you may get an infection or bleed more easily. If you have any dental work done, tell your dentist you are receiving this medication ?Check with your care team if you get an attack of severe diarrhea, nausea and vomiting, or if you sweat a lot. The loss of too much body fluid can make it dangerous for you to take this medication. ?Talk to your care team about your risk of  cancer. You may be more at risk for certain types of cancers if you take this medication. ?Do not become pregnant while taking this medication or for 6 months after stopping it. Women should inform their care team if they wish to become pregnant or think they might be pregnant. Men should not father a child while taking this medication and for 3 months after stopping it. There is potential for serious harm to an unborn child. Talk to your care team for more information. Do not breast-feed an infant while taking this medication or for 1 week after stopping it. ?This medication may make it more difficult to get pregnant or father a child. Talk to your care team if you are concerned about your fertility. ?What side effects may I notice from receiving this medication? ?Side effects that you should report to your care team as soon as possible: ?Allergic reactions--skin rash, itching, hives, swelling of the face, lips, tongue, or throat ?Blood clot--pain, swelling, or warmth in the leg, shortness of breath, chest pain ?Dry cough, shortness of breath or trouble breathing ?Infection--fever, chills, cough, sore throat, wounds that don't heal, pain or trouble when passing urine, general feeling of discomfort or being unwell ?Kidney injury--decrease in the amount of urine, swelling of the ankles, hands, or feet ?Liver injury--right upper belly pain, loss of appetite, nausea, light-colored stool, dark yellow or brown urine, yellowing of the skin or eyes, unusual weakness or fatigue ?Low red blood cell count--unusual weakness or fatigue, dizziness, headache, trouble breathing ?Redness, blistering, peeling, or loosening  of the skin, including inside the mouth ?Seizures ?Unusual bruising or bleeding ?Side effects that usually do not require medical attention (report to your care team if they continue or are bothersome): ?Diarrhea ?Dizziness ?Hair loss ?Nausea ?Pain, redness, or swelling with sores inside the mouth or  throat ?Vomiting ?This list may not describe all possible side effects. Call your doctor for medical advice about side effects. You may report side effects to FDA at 1-800-FDA-1088. ?Where should I keep my medication? ?This medication is gi

## 2021-08-02 NOTE — Progress Notes (Deleted)
Office Visit Note  Patient: [name]             Date of Birth: [birth year]           MRN: [id number]             PCP: Lorelee Market, MD Referring: Lorelee Market, MD Visit Date: 08/15/2021 Occupation: '@GUAROCC'$ @  Subjective:  No chief complaint on file.   History of Present Illness: [name] is a 51 y.o. female ***   Activities of Daily Living:  Patient reports morning stiffness for *** {minute/hour:19697}.   Patient {ACTIONS;DENIES/REPORTS:21021675::"Denies"} nocturnal pain.  Difficulty dressing/grooming: {ACTIONS;DENIES/REPORTS:21021675::"Denies"} Difficulty climbing stairs: {ACTIONS;DENIES/REPORTS:21021675::"Denies"} Difficulty getting out of chair: {ACTIONS;DENIES/REPORTS:21021675::"Denies"} Difficulty using hands for taps, buttons, cutlery, and/or writing: {ACTIONS;DENIES/REPORTS:21021675::"Denies"}  No Rheumatology ROS completed.   PMFS History:  Patient Active Problem List   Diagnosis Date Noted   Polyarthralgia 04/26/2021   Kidney stone 05/06/2012   Dense breasts 02/05/2012   Anemia    Anxiety    Arthritis    Hypertension    Liver mass    Ovarian cyst    Insomnia    H/O hypercholesterolemia    GERD (gastroesophageal reflux disease) 11/29/2010   Fibroadenoma of breast 11/11/2009    Past Medical History:  Diagnosis Date   Anemia    Anxiety    Arthritis    lower back   Depression    Endocervical polyp    h/o   Fibroadenoma of breast    left breast   Fibroids    h/o   GERD (gastroesophageal reflux disease)    on nexium-instructed to take dos   H/O hypercholesterolemia    History of right oophorectomy    Hypertension    controlled on hctz-b/p 142/93 at pat visit-will do ekg at pat visit   Insomnia    Liver mass 12/2007   benign- per pt " no longer there"   Ovarian cyst    h/o    Family History  Problem Relation Age of Onset   Rheum arthritis Mother    Pancreatic cancer Maternal Grandmother    Past Surgical History:   Procedure Laterality Date   ABDOMINAL HYSTERECTOMY  11/29/2010   BILATERAL SALPINGECTOMY Bilateral 06/12/2012   Procedure: BILATERAL SALPINGECTOMY;  Surgeon: Eldred Manges, MD;  Location: [facility];  Service: Gynecology;  Laterality: Bilateral;   COLON SURGERY     ECTOPIC PREGNANCY SURGERY  1992   LAPAROSCOPIC ASSISTED VAGINAL HYSTERECTOMY  11/29/2010   Procedure: LAPAROSCOPIC ASSISTED VAGINAL HYSTERECTOMY;  Surgeon: Eldred Manges, MD;  Location: [facility];  Service: Gynecology;  Laterality: N/A;   LAPAROSCOPY N/A 06/12/2012   Procedure: LAPAROSCOPY OPERATIVE;  Surgeon: Eldred Manges, MD;  Location: [facility];  Service: Gynecology;  Laterality: N/A;   MYOMECTOMY  2006   Social History   Social History Narrative   Not on file   Immunization History  Administered Date(s) Administered   PFIZER(Purple Top)SARS-COV-2 Vaccination 01/02/2020, 01/23/2020     Objective: Vital Signs: LMP 11/03/2010    Physical Exam   Musculoskeletal Exam: ***  CDAI Exam: CDAI Score: -- Patient Global: --; Provider Global: -- Swollen: --; Tender: -- Joint Exam 08/15/2021   No joint exam has been documented for this visit   There is currently no information documented on the homunculus. Go to the Rheumatology activity and complete the homunculus joint exam.  Investigation: No additional findings.  Imaging: No results found.  Recent Labs: Lab Results  Component Value Date   WBC 3.4 (L)  04/26/2021   HGB 12.5 04/26/2021   PLT 275 04/26/2021   NA 141 04/26/2021   K 4.0 04/26/2021   CL 106 04/26/2021   CO2 25 04/26/2021   GLUCOSE 79 04/26/2021   BUN 18 04/26/2021   CREATININE 0.65 04/26/2021   BILITOT 0.8 04/26/2021   ALKPHOS 107 10/02/2014   AST 22 04/26/2021   ALT 10 04/26/2021   PROT 7.1 04/26/2021   PROT 7.0 04/26/2021   ALBUMIN 4.2 10/02/2014   CALCIUM 9.5 04/26/2021   GFRAA >60 10/02/2014   QFTBGOLDPLUS NEGATIVE 04/26/2021    Speciality Comments: No specialty comments  available.  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  No orders of the defined types were placed in this encounter.   Face-to-face time spent with patient was *** minutes. Greater than 50% of time was spent in counseling and coordination of care.  Follow-Up Instructions: No follow-ups on file.   Ofilia Neas, PA-C  Note - This record has been created using Dragon software.  Chart creation errors have been sought, but may not always  have been located. Such creation errors do not reflect on  the standard of medical care.

## 2021-08-08 ENCOUNTER — Ambulatory Visit
Admission: RE | Admit: 2021-08-08 | Discharge: 2021-08-08 | Disposition: A | Discharge status: Home / Self Care | Payer: Medicare Other | Source: Ambulatory Visit | Attending: Obstetrics & Gynecology | Admitting: Obstetrics & Gynecology

## 2021-08-08 DIAGNOSIS — Z1231 Encounter for screening mammogram for malignant neoplasm of breast: Secondary | ICD-10-CM

## 2021-08-09 ENCOUNTER — Other Ambulatory Visit: Payer: Self-pay | Admitting: Rheumatology

## 2021-08-09 NOTE — Telephone Encounter (Signed)
Patient called the office stating she was recently put on Methotrexate and she no longer wishes to take it. Patient states its making her very sick to her stomach and is very fatigued. Patient requests a call back as soon as possible as the medication is making her feel terrible.

## 2021-08-09 NOTE — Telephone Encounter (Signed)
We will discuss other treatment options at the follow-up visit.  Please call in prednisone taper starting at 20 mg p.o. daily and taper by 5 mg every 4 days.  Side effects of prednisone include hypertension, diabetes, heart disease, cataracts, osteoporosis and weight gain.

## 2021-08-10 ENCOUNTER — Other Ambulatory Visit: Payer: Self-pay | Admitting: Obstetrics & Gynecology

## 2021-08-10 DIAGNOSIS — R928 Other abnormal and inconclusive findings on diagnostic imaging of breast: Secondary | ICD-10-CM

## 2021-08-10 MED ORDER — PREDNISONE 5 MG PO TABS: ORAL_TABLET | ORAL | 0 refills | Status: DC

## 2021-08-10 NOTE — Telephone Encounter (Signed)
Patient advised we will discuss other treatment options at the follow-up visit.  Prescription sent in prednisone taper starting at 20 mg p.o. daily and taper by 5 mg every 4 days. Patient advised side effects of prednisone include hypertension, diabetes, heart disease, cataracts, osteoporosis and weight gain. Patient expressed understanding.

## 2021-08-15 ENCOUNTER — Ambulatory Visit: Payer: Medicare Other | Admitting: Physician Assistant

## 2021-08-15 DIAGNOSIS — Z8719 Personal history of other diseases of the digestive system: Secondary | ICD-10-CM

## 2021-08-15 DIAGNOSIS — N200 Calculus of kidney: Secondary | ICD-10-CM

## 2021-08-15 DIAGNOSIS — Z8669 Personal history of other diseases of the nervous system and sense organs: Secondary | ICD-10-CM

## 2021-08-15 DIAGNOSIS — M0579 Rheumatoid arthritis with rheumatoid factor of multiple sites without organ or systems involvement: Secondary | ICD-10-CM

## 2021-08-15 DIAGNOSIS — M79641 Pain in right hand: Secondary | ICD-10-CM

## 2021-08-15 DIAGNOSIS — M19071 Primary osteoarthritis, right ankle and foot: Secondary | ICD-10-CM

## 2021-08-15 DIAGNOSIS — Z8639 Personal history of other endocrine, nutritional and metabolic disease: Secondary | ICD-10-CM

## 2021-08-15 DIAGNOSIS — Z79899 Other long term (current) drug therapy: Secondary | ICD-10-CM

## 2021-08-15 DIAGNOSIS — G4709 Other insomnia: Secondary | ICD-10-CM

## 2021-08-15 DIAGNOSIS — R768 Other specified abnormal immunological findings in serum: Secondary | ICD-10-CM

## 2021-08-15 DIAGNOSIS — I10 Essential (primary) hypertension: Secondary | ICD-10-CM

## 2021-08-15 DIAGNOSIS — Z8261 Family history of arthritis: Secondary | ICD-10-CM

## 2021-08-15 DIAGNOSIS — R160 Hepatomegaly, not elsewhere classified: Secondary | ICD-10-CM

## 2021-08-15 DIAGNOSIS — G8929 Other chronic pain: Secondary | ICD-10-CM

## 2021-08-15 DIAGNOSIS — Z8659 Personal history of other mental and behavioral disorders: Secondary | ICD-10-CM

## 2021-08-22 ENCOUNTER — Ambulatory Visit
Admission: RE | Admit: 2021-08-22 | Discharge: 2021-08-22 | Disposition: A | Discharge status: Home / Self Care | Payer: Medicare Other | Source: Ambulatory Visit | Attending: Obstetrics & Gynecology | Admitting: Obstetrics & Gynecology

## 2021-08-22 ENCOUNTER — Ambulatory Visit: Payer: Medicare Other

## 2021-08-22 DIAGNOSIS — R928 Other abnormal and inconclusive findings on diagnostic imaging of breast: Secondary | ICD-10-CM

## 2021-09-05 NOTE — Progress Notes (Signed)
Office Visit Note  Patient: [name]             Date of Birth: 16-Feb-1971           MRN: [id number]             PCP: Lorelee Market, MD Referring: Lorelee Market, MD Visit Date: 09/19/2021 Occupation: '@GUAROCC'$ @  Subjective:  Discontinued methotrexate   History of Present Illness: [name] is a 51 y.o. female with history of seropositive rheumatoid arthritis. She was started on injectable methotrexate after her last office visit on 07/05/21.  Patient reports that she took 3 doses of methotrexate and discontinued due to experiencing significant fatigue as well as nausea after each injection.  She states that the fatigue was profound to the point that her employer suggested that she work from home for several days after her weekly methotrexate injections.  She does not want to restart on methotrexate at this time since the symptoms resolved after discontinuing methotrexate.  She states that she was unable to notice any improvement in her symptoms while taking methotrexate briefly.  She completed a prednisone taper about 2 weeks ago which alleviated her joint pain and inflammation in her hands.  Overall she feels that her rheumatoid arthritis has been mild and only affects her hands intermittently.  She would like to discuss other treatment options today.     Activities of Daily Living:  Patient reports morning stiffness for 0 minutes.   Patient Denies nocturnal pain.  Difficulty dressing/grooming: Denies Difficulty climbing stairs: Denies Difficulty getting out of chair: Denies Difficulty using hands for taps, buttons, cutlery, and/or writing: Denies  Review of Systems  Constitutional:  Negative for fatigue.  HENT:  Negative for mouth sores, mouth dryness and nose dryness.   Eyes:  Negative for pain, visual disturbance and dryness.  Respiratory:  Negative for cough, hemoptysis, shortness of breath and difficulty breathing.   Cardiovascular:  Negative for chest  pain, palpitations, hypertension and swelling in legs/feet.  Gastrointestinal:  Negative for blood in stool, constipation and diarrhea.  Endocrine: Negative for increased urination.  Genitourinary:  Negative for painful urination and involuntary urination.  Musculoskeletal:  Negative for joint pain, joint pain, joint swelling, myalgias, muscle weakness, morning stiffness, muscle tenderness and myalgias.  Skin:  Negative for color change, pallor, rash, hair loss, nodules/bumps, skin tightness, ulcers and sensitivity to sunlight.  Allergic/Immunologic: Negative for susceptible to infections.  Neurological:  Negative for dizziness, numbness, headaches and weakness.  Hematological:  Negative for swollen glands.  Psychiatric/Behavioral:  Negative for depressed mood and sleep disturbance. The patient is not nervous/anxious.     PMFS History:  Patient Active Problem List   Diagnosis Date Noted   Polyarthralgia 04/26/2021   Kidney stone 05/06/2012   Dense breasts 02/05/2012   Anemia    Anxiety    Arthritis    Hypertension    Liver mass    Ovarian cyst    Insomnia    H/O hypercholesterolemia    GERD (gastroesophageal reflux disease) 11/29/2010   Fibroadenoma of breast 11/11/2009    Past Medical History:  Diagnosis Date   Anemia    Anxiety    Arthritis    lower back   Depression    Endocervical polyp    h/o   Fibroadenoma of breast    left breast   Fibroids    h/o   GERD (gastroesophageal reflux disease)    on nexium-instructed to take dos   H/O hypercholesterolemia  History of right oophorectomy    Hypertension    controlled on hctz-b/p 142/93 at pat visit-will do ekg at pat visit   Insomnia    Liver mass 12/2007   benign- per pt " no longer there"   Ovarian cyst    h/o    Family History  Problem Relation Age of Onset   Rheum arthritis Mother    Pancreatic cancer Maternal Grandmother    Past Surgical History:  Procedure Laterality Date   ABDOMINAL HYSTERECTOMY   11/29/2010   BILATERAL SALPINGECTOMY Bilateral 06/12/2012   Procedure: BILATERAL SALPINGECTOMY;  Surgeon: Eldred Manges, MD;  Location: [facility];  Service: Gynecology;  Laterality: Bilateral;   COLON SURGERY     ECTOPIC PREGNANCY SURGERY  1992   LAPAROSCOPIC ASSISTED VAGINAL HYSTERECTOMY  11/29/2010   Procedure: LAPAROSCOPIC ASSISTED VAGINAL HYSTERECTOMY;  Surgeon: Eldred Manges, MD;  Location: [facility];  Service: Gynecology;  Laterality: N/A;   LAPAROSCOPY N/A 06/12/2012   Procedure: LAPAROSCOPY OPERATIVE;  Surgeon: Eldred Manges, MD;  Location: [facility];  Service: Gynecology;  Laterality: N/A;   MYOMECTOMY  2006   Social History   Social History Narrative   Not on file   Immunization History  Administered Date(s) Administered   PFIZER(Purple Top)SARS-COV-2 Vaccination 01/02/2020, 01/23/2020     Objective: Vital Signs: BP (!) 164/99 (BP Location: Left Arm, Patient Position: Sitting, Cuff Size: Normal)   Pulse 78   Ht '4\' 11"'$  (1.499 m)   Wt 136 lb (61.7 kg)   LMP 11/03/2010   BMI 27.47 kg/m    Physical Exam Vitals and nursing note reviewed.  Constitutional:      Appearance: She is well-developed.  HENT:     Head: Normocephalic and atraumatic.  Eyes:     Conjunctiva/sclera: Conjunctivae normal.  Cardiovascular:     Rate and Rhythm: Normal rate and regular rhythm.     Heart sounds: Normal heart sounds.  Pulmonary:     Effort: Pulmonary effort is normal.     Breath sounds: Normal breath sounds.  Abdominal:     General: Bowel sounds are normal.     Palpations: Abdomen is soft.  Musculoskeletal:     Cervical back: Normal range of motion.  Skin:    General: Skin is warm and dry.     Capillary Refill: Capillary refill takes less than 2 seconds.  Neurological:     Mental Status: She is alert and oriented to person, place, and time.  Psychiatric:        Behavior: Behavior normal.      Musculoskeletal Exam: C-spine, thoracic spine, and lumbar spine good ROM.  No  midline spinal tenderness.  Shoulder joints, elbow joints, wrist joints, MCPs, PIPs, and DIPs good ROM with no synovitis.  Complete fist formation bilaterally.  Hip joints, knee joints, and ankle joints have good ROM with no discomfort.  No warmth or effusion of knee joints.  No tenderness or swelling of ankle joints.   CDAI Exam: CDAI Score: 0.2  Patient Global: 1 mm; Provider Global: 1 mm Swollen: 0 ; Tender: 0  Joint Exam 09/19/2021   No joint exam has been documented for this visit   There is currently no information documented on the homunculus. Go to the Rheumatology activity and complete the homunculus joint exam.  Investigation: No additional findings.  Imaging: MM DIAG BREAST TOMO UNI RIGHT  Result Date: 08/22/2021 CLINICAL DATA:  Callback from screening mammogram for possible distortion right breast EXAM: DIGITAL DIAGNOSTIC UNILATERAL RIGHT MAMMOGRAM  WITH TOMOSYNTHESIS AND CAD TECHNIQUE: Right digital diagnostic mammography and breast tomosynthesis was performed. The images were evaluated with computer-aided detection. COMPARISON:  Previous exam(s). ACR Breast Density Category b: There are scattered areas of fibroglandular density. FINDINGS: Lateral view of right breast, spot compression right cc view are submitted. The previously questioned architectural distortion does not persist on additional views. On the additional views, the breast parenchyma in the area of question is unchanged compared to multiple prior mammograms dating back to 2014. IMPRESSION: Benign findings. RECOMMENDATION: Routine screening mammogram back on schedule. I have discussed the findings and recommendations with the patient. If applicable, a reminder letter will be sent to the patient regarding the next appointment. BI-RADS CATEGORY  2: Benign. Electronically Signed   By: Abelardo Diesel M.D.   On: 08/22/2021 10:35   Recent Labs: Lab Results  Component Value Date   WBC 3.4 (L) 04/26/2021   HGB 12.5 04/26/2021    PLT 275 04/26/2021   NA 141 04/26/2021   K 4.0 04/26/2021   CL 106 04/26/2021   CO2 25 04/26/2021   GLUCOSE 79 04/26/2021   BUN 18 04/26/2021   CREATININE 0.65 04/26/2021   BILITOT 0.8 04/26/2021   ALKPHOS 107 10/02/2014   AST 22 04/26/2021   ALT 10 04/26/2021   PROT 7.1 04/26/2021   PROT 7.0 04/26/2021   ALBUMIN 4.2 10/02/2014   CALCIUM 9.5 04/26/2021   GFRAA >60 10/02/2014   QFTBGOLDPLUS NEGATIVE 04/26/2021    Speciality Comments: No specialty comments available.  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  She completed a prednisone taper about 2 weeks ago which alleviated the symptoms she was experiencing in both hands.  After her last office visit on 07/05/2021 she was initiated on injectable methotrexate.  She took 3 doses of methotrexate but discontinued due to experiencing significant fatigue and nausea.  She does not want to restart her methotrexate at this time and presented today to discuss other treatment options.  She is apprehensive to start on Biologics currently.  Indications, contraindications, potential side effects of Plaquenil were discussed today in detail.  She will start on Plaquenil 200 mg 1 tablet by mouth twice daily Monday through Friday pending lab results today.  In the past she developed a mild rash while taking a sulfa antibiotic so I discussed the possibility of cross-reactivity.  She will notify us if she cannot tolerate taking Plaquenil or has any signs or symptoms of an allergic reaction.  She plans on starting on low-dose Plaquenil once daily M-F for the first  week and then will increase to twice daily Monday through Friday if tolerated.  Discussed the importance of scheduling a baseline Plaquenil eye examination.  She will return for lab work in 1 month, 3 months, then every 5 months.  Standing orders for CBC and CMP remain in place. She will return to the office in 6 to 8 weeks to assess her response.  Patient was counseled on the purpose, proper use, and adverse effects of hydroxychloroquine including nausea/diarrhea, skin rash, headaches, and sun sensitivity.  Advised patient to wear sunscreen once starting hydroxychloroquine to reduce risk of rash associated with sun sensitivity.  Discussed importance of annual eye  exams while on hydroxychloroquine to monitor to ocular toxicity and discussed importance of frequent laboratory monitoring.  Provided patient with eye exam form for baseline ophthalmologic exam.  Reviewed risk for QTC prolongation when used in combination with other QTc prolonging agents (including but not limited to antiarrhythmics, macrolide antibiotics, flouroquinolones, tricyclic antidepressants, citalopram, specific antipsychotics, ondansetron, migraine triptans, and methadone). Provided patient with educational materials on hydroxychloroquine and answered all questions.  Patient consented to hydroxychloroquine. Will upload consent in the media tab.    Dose will be Plaquenil 200 mg twice daily Monday through Friday.  Prescription pending lab results.  High risk medication use - She will be starting on Plaquenil 200 mg 1 tablet by mouth twice daily Monday through Friday only.   Discontinued injectable methotrexate after 3 doses due to significant fatigue and nausea.  CBC and CMP updated on 04/26/21.  CBC and CMP were released today.  G6PD will also be drawn today.  She will return for lab work in 1 month, 3 months, then every 5 months.  Standing orders for CBC and CMP remain in place.- Plan: CBC with Differential/Platelet, COMPLETE METABOLIC  PANEL WITH GFR, Glucose 6 phosphate dehydrogenase  Pain in both hands: She has no tenderness or synovitis on examination today.  Complete fist formation bilaterally.   Chronic left shoulder pain - X-rays were unremarkable.  She has good ROM of the left shoulder with no discomfort at this time.   Pain in both feet - X-rays showed early osteoarthritic changes. She is not experiencing any discomfort in her feet at this time.  She has good ROM of both ankle joints with no tenderness or joint swelling.  Positive ANA (antinuclear antibody) - Low titer positive ANA, ENA negative.  Patient had no clinical features of lupus.  ENA was negative and complements were normal.    Elevated CK: CK 206 on 04/26/21. No muscular weakness.  CK can be rechecked with upcoming lab work. Future order will be placed today.   Other medical conditions are listed as follows:   Primary hypertension  H/O hypercholesterolemia  History of gastroesophageal reflux (GERD)  Kidney stone  Liver mass - CT scan in 2016 showed benign liver lesion.  Other insomnia  History of anxiety  History of sleep apnea  Family history of rheumatoid arthritis in mother  Orders: Orders Placed This Encounter  Procedures   CBC with Differential/Platelet   COMPLETE METABOLIC PANEL WITH GFR   Glucose 6 phosphate dehydrogenase   CK   No orders of the defined types were placed in this encounter.   Follow-Up Instructions: Return in about 8 weeks (around 11/14/2021) for Rheumatoid arthritis.   Ofilia Neas, PA-C  Note - This record has been created using Dragon software.  Chart creation errors have been sought, but may not always  have been located. Such creation errors do not reflect on  the standard of medical care.

## 2021-09-19 ENCOUNTER — Encounter: Payer: Self-pay | Admitting: Physician Assistant

## 2021-09-19 ENCOUNTER — Ambulatory Visit (INDEPENDENT_AMBULATORY_CARE_PROVIDER_SITE_OTHER): Payer: BC Managed Care – PPO | Admitting: Physician Assistant

## 2021-09-19 VITALS — BP 164/99 | HR 78 | Ht 59.0 in | Wt 136.0 lb

## 2021-09-19 DIAGNOSIS — Z8659 Personal history of other mental and behavioral disorders: Secondary | ICD-10-CM

## 2021-09-19 DIAGNOSIS — M25512 Pain in left shoulder: Secondary | ICD-10-CM

## 2021-09-19 DIAGNOSIS — I10 Essential (primary) hypertension: Secondary | ICD-10-CM

## 2021-09-19 DIAGNOSIS — M0579 Rheumatoid arthritis with rheumatoid factor of multiple sites without organ or systems involvement: Secondary | ICD-10-CM

## 2021-09-19 DIAGNOSIS — M79641 Pain in right hand: Secondary | ICD-10-CM

## 2021-09-19 DIAGNOSIS — G8929 Other chronic pain: Secondary | ICD-10-CM

## 2021-09-19 DIAGNOSIS — R748 Abnormal levels of other serum enzymes: Secondary | ICD-10-CM

## 2021-09-19 DIAGNOSIS — G4709 Other insomnia: Secondary | ICD-10-CM

## 2021-09-19 DIAGNOSIS — Z8261 Family history of arthritis: Secondary | ICD-10-CM

## 2021-09-19 DIAGNOSIS — Z8719 Personal history of other diseases of the digestive system: Secondary | ICD-10-CM

## 2021-09-19 DIAGNOSIS — M79671 Pain in right foot: Secondary | ICD-10-CM

## 2021-09-19 DIAGNOSIS — M79672 Pain in left foot: Secondary | ICD-10-CM

## 2021-09-19 DIAGNOSIS — N200 Calculus of kidney: Secondary | ICD-10-CM

## 2021-09-19 DIAGNOSIS — Z79899 Other long term (current) drug therapy: Secondary | ICD-10-CM | POA: Diagnosis not present

## 2021-09-19 DIAGNOSIS — Z8669 Personal history of other diseases of the nervous system and sense organs: Secondary | ICD-10-CM

## 2021-09-19 DIAGNOSIS — R160 Hepatomegaly, not elsewhere classified: Secondary | ICD-10-CM

## 2021-09-19 DIAGNOSIS — M79642 Pain in left hand: Secondary | ICD-10-CM

## 2021-09-19 DIAGNOSIS — R768 Other specified abnormal immunological findings in serum: Secondary | ICD-10-CM

## 2021-09-19 DIAGNOSIS — Z8639 Personal history of other endocrine, nutritional and metabolic disease: Secondary | ICD-10-CM

## 2021-09-19 NOTE — Patient Instructions (Signed)
Standing Labs We placed an order today for your standing lab work.   Please have your standing labs drawn in 1 month, 3 months, then every 5 months  If possible, please have your labs drawn 2 weeks prior to your appointment so that the provider can discuss your results at your appointment.  Please note that you may see your imaging and lab results in [facility] before we have reviewed them. We may be awaiting multiple results to interpret others before contacting you. Please allow our office up to 72 hours to thoroughly review all of the results before contacting the office for clarification of your results.  We have open lab daily: Monday through Thursday from 1:30-4:30 PM and Friday from 1:30-4:00 PM at the office of Dr. Bo Merino, [facility].   Please be advised, all patients with office appointments requiring lab work will take precedent over walk-in lab work.  If possible, please come for your lab work on Monday and Friday afternoons, as you may experience shorter wait times. The office is located at [address], [place], [place], [place] [address] No appointment is necessary.   Labs are drawn by [facility]. Please bring your co-pay at the time of your lab draw.  You may receive a bill from [facility] for your lab work.  Please note if you are on Hydroxychloroquine and and an order has been placed for a Hydroxychloroquine level, you will need to have it drawn 4 hours or more after your last dose.  If you wish to have your labs drawn at another location, please call the office 24 hours in advance to send orders.  If you have any questions regarding directions or hours of operation,  please call [phone].   As a reminder, please drink plenty of water prior to coming for your lab work. Thanks!   Hydroxychloroquine Tablets What is this medication? HYDROXYCHLOROQUINE (hye drox ee KLOR oh kwin) treats autoimmune conditions, such as rheumatoid arthritis and lupus. It  works by slowing down an overactive immune system. It may also be used to prevent and treat malaria. It works by killing the parasite that causes malaria. It belongs to a group of medications called DMARDs. This medicine may be used for other purposes; ask your health care provider or pharmacist if you have questions. COMMON BRAND NAME(S): Plaquenil, Quineprox What should I tell my care team before I take this medication? They need to know if you have any of these conditions: Diabetes Eye disease, vision problems G6PD deficiency Heart disease History of irregular heartbeat If you often drink alcohol Kidney disease Liver disease Porphyria Psoriasis An unusual or allergic reaction to chloroquine, hydroxychloroquine, other medications, foods, dyes, or preservatives Pregnant or trying to get pregnant Breast-feeding How should I use this medication? Take this medication by mouth with a glass of water. Take it as directed on the prescription label. Do not cut, crush or chew this medication. Swallow the tablets whole. Take it with food. Do not take it more than directed. Take all of this medication unless your care team tells you to stop it early. Keep taking it even if you think you are better. Take products with antacids in them at a different time of day than this medication. Take this medication 4 hours before or 4 hours after antacids. Talk to your care team if you have questions. Talk to your care team about the use of this medication in children. While this medication may be prescribed for selected conditions, precautions do  apply. Overdosage: If you think you have taken too much of this medicine contact a poison control center or emergency room at once. NOTE: This medicine is only for you. Do not share this medicine with others. What if I miss a dose? If you miss a dose, take it as soon as you can. If it is almost time for your next dose, take only that dose. Do not take double or extra  doses. What may interact with this medication? Do not take this medication with any of the following: Cisapride Dronedarone Pimozide Thioridazine This medication may also interact with the following: Ampicillin Antacids Cimetidine Cyclosporine Digoxin Kaolin Medications for diabetes, like insulin, glipizide, glyburide Medications for seizures like carbamazepine, phenobarbital, phenytoin Mefloquine Methotrexate Other medications that prolong the QT interval (cause an abnormal heart rhythm) Praziquantel This list may not describe all possible interactions. Give your health care provider a list of all the medicines, herbs, non-prescription drugs, or dietary supplements you use. Also tell them if you smoke, drink alcohol, or use illegal drugs. Some items may interact with your medicine. What should I watch for while using this medication? Visit your care team for regular checks on your progress. Tell your care team if your symptoms do not start to get better or if they get worse. You may need blood work done while you are taking this medication. If you take other medications that can affect heart rhythm, you may need more testing. Talk to your care team if you have questions. Your vision may be tested before and during use of this medication. Tell your care team right away if you have any change in your eyesight. This medication may cause serious skin reactions. They can happen weeks to months after starting the medication. Contact your care team right away if you notice fevers or flu-like symptoms with a rash. The rash may be red or purple and then turn into blisters or peeling of the skin. Or, you might notice a red rash with swelling of the face, lips or lymph nodes in your neck or under your arms. If you or your family notice any changes in your behavior, such as new or worsening depression, thoughts of harming yourself, anxiety, or other unusual or disturbing thoughts, or memory loss, call  your care team right away. What side effects may I notice from receiving this medication? Side effects that you should report to your care team as soon as possible: Allergic reactions--skin rash, itching, hives, swelling of the face, lips, tongue, or throat Aplastic anemia--unusual weakness or fatigue, dizziness, headache, trouble breathing, increased bleeding or bruising Change in vision Heart rhythm changes--fast or irregular heartbeat, dizziness, feeling faint or lightheaded, chest pain, trouble breathing Infection--fever, chills, cough, or sore throat Low blood sugar (hypoglycemia)--tremors or shaking, anxiety, sweating, cold or clammy skin, confusion, dizziness, rapid heartbeat Muscle injury--unusual weakness or fatigue, muscle pain, dark yellow or brown urine, decrease in amount of urine Pain, tingling, or numbness in the hands or feet Rash, fever, and swollen lymph nodes Redness, blistering, peeling, or loosening of the skin, including inside the mouth Thoughts of suicide or self-harm, worsening mood, or feelings of depression Unusual bruising or bleeding Side effects that usually do not require medical attention (report to your care team if they continue or are bothersome): Diarrhea Headache Nausea Stomach pain Vomiting This list may not describe all possible side effects. Call your doctor for medical advice about side effects. You may report side effects to FDA at 1-800-FDA-1088. Where should I  keep my medication? Keep out of the reach of children and pets. Store at room temperature up to 30 degrees C (86 degrees F). Protect from light. Get rid of any unused medication after the expiration date. To get rid of medications that are no longer needed or have expired: Take the medication to a medication take-back program. Check with your pharmacy or law enforcement to find a location. If you cannot return the medication, check the label or package insert to see if the medication should  be thrown out in the garbage or flushed down the toilet. If you are not sure, ask your care team. If it is safe to put it in the trash, empty the medication out of the container. Mix the medication with cat litter, dirt, coffee grounds, or other unwanted substance. Seal the mixture in a bag or container. Put it in the trash. NOTE: This sheet is a summary. It may not cover all possible information. If you have questions about this medicine, talk to your doctor, pharmacist, or health care provider.  2023 Elsevier/Gold Standard (2020-07-01 00:00:00)

## 2021-09-20 ENCOUNTER — Other Ambulatory Visit: Payer: Self-pay | Admitting: *Deleted

## 2021-09-20 MED ORDER — HYDROXYCHLOROQUINE SULFATE 200 MG PO TABS: ORAL_TABLET | ORAL | 2 refills | Status: DC

## 2021-09-20 NOTE — Progress Notes (Signed)
CMP WNL.  WBC count is low-2.4.  absolute neutrophils are low. Patient took methotrexate for 3 weeks and discontinued.  The plan is to start plaquenil.   Recheck CBC and CMP in 1 month.

## 2021-09-20 NOTE — Telephone Encounter (Signed)
-----   Message from Ofilia Neas, PA-C sent at 09/20/2021  8:01 AM EDT ----- CMP WNL.  WBC count is low-2.4.  absolute neutrophils are low. Patient took methotrexate for 3 weeks and discontinued.  The plan is to start plaquenil.   Recheck CBC and CMP in 1 month.

## 2021-09-20 NOTE — Telephone Encounter (Signed)
Plaquenil 200 mg 1 tablet by mouth twice daily Monday through Friday

## 2021-09-24 LAB — CBC WITH DIFFERENTIAL/PLATELET
Absolute Monocytes: 262 cells/uL (ref 200–950)
Basophils Absolute: 10 cells/uL (ref 0–200)
Basophils Relative: 0.4 %
Eosinophils Absolute: 31 cells/uL (ref 15–500)
Eosinophils Relative: 1.3 %
HCT: 37.3 % (ref 35.0–45.0)
Hemoglobin: 12.1 g/dL (ref 11.7–15.5)
Lymphs Abs: 1154 cells/uL (ref 850–3900)
MCH: 30.8 pg (ref 27.0–33.0)
MCHC: 32.4 g/dL (ref 32.0–36.0)
MCV: 94.9 fL (ref 80.0–100.0)
MPV: 11.1 fL (ref 7.5–12.5)
Monocytes Relative: 10.9 %
Neutro Abs: 943 cells/uL — ABNORMAL LOW (ref 1500–7800)
Neutrophils Relative %: 39.3 %
Platelets: 237 10*3/uL (ref 140–400)
RBC: 3.93 10*6/uL (ref 3.80–5.10)
RDW: 11.2 % (ref 11.0–15.0)
Total Lymphocyte: 48.1 %
WBC: 2.4 10*3/uL — ABNORMAL LOW (ref 3.8–10.8)

## 2021-09-24 LAB — COMPLETE METABOLIC PANEL WITH GFR
AG Ratio: 1.6 (calc) (ref 1.0–2.5)
ALT: 9 U/L (ref 6–29)
AST: 17 U/L (ref 10–35)
Albumin: 4.1 g/dL (ref 3.6–5.1)
Alkaline phosphatase (APISO): 121 U/L (ref 37–153)
BUN: 11 mg/dL (ref 7–25)
CO2: 27 mmol/L (ref 20–32)
Calcium: 9.1 mg/dL (ref 8.6–10.4)
Chloride: 104 mmol/L (ref 98–110)
Creat: 0.63 mg/dL (ref 0.50–1.03)
Globulin: 2.5 g/dL (calc) (ref 1.9–3.7)
Glucose, Bld: 83 mg/dL (ref 65–99)
Potassium: 4.1 mmol/L (ref 3.5–5.3)
Sodium: 142 mmol/L (ref 135–146)
Total Bilirubin: 0.7 mg/dL (ref 0.2–1.2)
Total Protein: 6.6 g/dL (ref 6.1–8.1)
eGFR: 106 mL/min/{1.73_m2} (ref >=60)

## 2021-09-24 LAB — GLUCOSE 6 PHOSPHATE DEHYDROGENASE: G-6PDH: 4.3 U/g Hgb — ABNORMAL LOW (ref 7.0–20.5)

## 2021-09-26 ENCOUNTER — Other Ambulatory Visit: Payer: Self-pay | Admitting: *Deleted

## 2021-09-26 DIAGNOSIS — R768 Other specified abnormal immunological findings in serum: Secondary | ICD-10-CM

## 2021-09-26 DIAGNOSIS — Z79899 Other long term (current) drug therapy: Secondary | ICD-10-CM

## 2021-09-26 DIAGNOSIS — M0579 Rheumatoid arthritis with rheumatoid factor of multiple sites without organ or systems involvement: Secondary | ICD-10-CM

## 2021-09-26 DIAGNOSIS — R748 Abnormal levels of other serum enzymes: Secondary | ICD-10-CM

## 2021-09-26 NOTE — Progress Notes (Signed)
G6PDH is low. Reviewed with Dr. Estanislado Pandy.   Ok to continue reduced dose of plaquenil.  Recheck CBC in 1 month and add plaquenil level as well.  Please advise the patient to skip her morning dose of plaquenil prior to coming in for lab work.

## 2021-10-11 NOTE — Progress Notes (Unsigned)
Office Visit Note  Patient: [name]             Date of Birth: Apr 27, 1970           MRN: [id number]             PCP: Lorelee Market, MD Referring: Lorelee Market, MD Visit Date: 10/12/2021 Occupation: '@GUAROCC'$ @  Subjective:  No chief complaint on file.   History of Present Illness: [name] is a 51 y.o. female ***   Activities of Daily Living:  Patient reports morning stiffness for *** {minute/hour:19697}.   Patient {ACTIONS;DENIES/REPORTS:21021675::"Denies"} nocturnal pain.  Difficulty dressing/grooming: {ACTIONS;DENIES/REPORTS:21021675::"Denies"} Difficulty climbing stairs: {ACTIONS;DENIES/REPORTS:21021675::"Denies"} Difficulty getting out of chair: {ACTIONS;DENIES/REPORTS:21021675::"Denies"} Difficulty using hands for taps, buttons, cutlery, and/or writing: {ACTIONS;DENIES/REPORTS:21021675::"Denies"}  No Rheumatology ROS completed.   PMFS History:  Patient Active Problem List   Diagnosis Date Noted   Polyarthralgia 04/26/2021   Kidney stone 05/06/2012   Dense breasts 02/05/2012   Anemia    Anxiety    Arthritis    Hypertension    Liver mass    Ovarian cyst    Insomnia    H/O hypercholesterolemia    GERD (gastroesophageal reflux disease) 11/29/2010   Fibroadenoma of breast 11/11/2009    Past Medical History:  Diagnosis Date   Anemia    Anxiety    Arthritis    lower back   Depression    Endocervical polyp    h/o   Fibroadenoma of breast    left breast   Fibroids    h/o   GERD (gastroesophageal reflux disease)    on nexium-instructed to take dos   H/O hypercholesterolemia    History of right oophorectomy    Hypertension    controlled on hctz-b/p 142/93 at pat visit-will do ekg at pat visit   Insomnia    Liver mass 12/2007   benign- per pt " no longer there"   Ovarian cyst    h/o    Family History  Problem Relation Age of Onset   Rheum arthritis Mother    Pancreatic cancer Maternal Grandmother    Past Surgical History:   Procedure Laterality Date   ABDOMINAL HYSTERECTOMY  11/29/2010   BILATERAL SALPINGECTOMY Bilateral 06/12/2012   Procedure: BILATERAL SALPINGECTOMY;  Surgeon: Eldred Manges, MD;  Location: [facility];  Service: Gynecology;  Laterality: Bilateral;   COLON SURGERY     ECTOPIC PREGNANCY SURGERY  1992   LAPAROSCOPIC ASSISTED VAGINAL HYSTERECTOMY  11/29/2010   Procedure: LAPAROSCOPIC ASSISTED VAGINAL HYSTERECTOMY;  Surgeon: Eldred Manges, MD;  Location: [facility];  Service: Gynecology;  Laterality: N/A;   LAPAROSCOPY N/A 06/12/2012   Procedure: LAPAROSCOPY OPERATIVE;  Surgeon: Eldred Manges, MD;  Location: [facility];  Service: Gynecology;  Laterality: N/A;   MYOMECTOMY  2006   Social History   Social History Narrative   Not on file   Immunization History  Administered Date(s) Administered   PFIZER(Purple Top)SARS-COV-2 Vaccination 01/02/2020, 01/23/2020     Objective: Vital Signs: LMP 11/03/2010    Physical Exam   Musculoskeletal Exam: ***  CDAI Exam: CDAI Score: -- Patient Global: --; Provider Global: -- Swollen: --; Tender: -- Joint Exam 10/12/2021   No joint exam has been documented for this visit   There is currently no information documented on the homunculus. Go to the Rheumatology activity and complete the homunculus joint exam.  Investigation: No additional findings.  Imaging: No results found.  Recent Labs: Lab Results  Component Value Date   WBC 2.4 (L)  09/19/2021   HGB 12.1 09/19/2021   PLT 237 09/19/2021   NA 142 09/19/2021   K 4.1 09/19/2021   CL 104 09/19/2021   CO2 27 09/19/2021   GLUCOSE 83 09/19/2021   BUN 11 09/19/2021   CREATININE 0.63 09/19/2021   BILITOT 0.7 09/19/2021   ALKPHOS 107 10/02/2014   AST 17 09/19/2021   ALT 9 09/19/2021   PROT 6.6 09/19/2021   ALBUMIN 4.2 10/02/2014   CALCIUM 9.1 09/19/2021   GFRAA >60 10/02/2014   QFTBGOLDPLUS NEGATIVE 04/26/2021    Speciality Comments: No specialty comments available.  Procedures:   No procedures performed Allergies: Oxycodone-acetaminophen, Sulfa antibiotics, Tetracyclines & related, Hydrocodone, Oxycodone-acetaminophen, Shellfish allergy, and Tetracycline   Assessment / Plan:     Visit Diagnoses: No diagnosis found.  Orders: No orders of the defined types were placed in this encounter.  No orders of the defined types were placed in this encounter.   Face-to-face time spent with patient was *** minutes. Greater than 50% of time was spent in counseling and coordination of care.  Follow-Up Instructions: No follow-ups on file.   Earnestine Mealing, CMA  Note - This record has been created using Editor, commissioning.  Chart creation errors have been sought, but may not always  have been located. Such creation errors do not reflect on  the standard of medical care.

## 2021-10-12 ENCOUNTER — Ambulatory Visit: Payer: BC Managed Care – PPO | Attending: Physician Assistant | Admitting: Physician Assistant

## 2021-10-12 ENCOUNTER — Telehealth: Payer: Self-pay | Admitting: *Deleted

## 2021-10-12 ENCOUNTER — Encounter: Payer: Self-pay | Admitting: Physician Assistant

## 2021-10-12 VITALS — BP 168/103 | HR 84 | Ht 59.0 in | Wt 137.4 lb

## 2021-10-12 DIAGNOSIS — I10 Essential (primary) hypertension: Secondary | ICD-10-CM

## 2021-10-12 DIAGNOSIS — R160 Hepatomegaly, not elsewhere classified: Secondary | ICD-10-CM | POA: Diagnosis present

## 2021-10-12 DIAGNOSIS — G4709 Other insomnia: Secondary | ICD-10-CM

## 2021-10-12 DIAGNOSIS — Z8639 Personal history of other endocrine, nutritional and metabolic disease: Secondary | ICD-10-CM

## 2021-10-12 DIAGNOSIS — Z8669 Personal history of other diseases of the nervous system and sense organs: Secondary | ICD-10-CM | POA: Diagnosis present

## 2021-10-12 DIAGNOSIS — M0579 Rheumatoid arthritis with rheumatoid factor of multiple sites without organ or systems involvement: Secondary | ICD-10-CM | POA: Diagnosis present

## 2021-10-12 DIAGNOSIS — Z8719 Personal history of other diseases of the digestive system: Secondary | ICD-10-CM | POA: Diagnosis present

## 2021-10-12 DIAGNOSIS — G8929 Other chronic pain: Secondary | ICD-10-CM

## 2021-10-12 DIAGNOSIS — M79642 Pain in left hand: Secondary | ICD-10-CM | POA: Diagnosis present

## 2021-10-12 DIAGNOSIS — R768 Other specified abnormal immunological findings in serum: Secondary | ICD-10-CM

## 2021-10-12 DIAGNOSIS — Z8659 Personal history of other mental and behavioral disorders: Secondary | ICD-10-CM | POA: Diagnosis present

## 2021-10-12 DIAGNOSIS — M25512 Pain in left shoulder: Secondary | ICD-10-CM | POA: Diagnosis present

## 2021-10-12 DIAGNOSIS — N200 Calculus of kidney: Secondary | ICD-10-CM

## 2021-10-12 DIAGNOSIS — Z8261 Family history of arthritis: Secondary | ICD-10-CM

## 2021-10-12 DIAGNOSIS — M79672 Pain in left foot: Secondary | ICD-10-CM | POA: Insufficient documentation

## 2021-10-12 DIAGNOSIS — M79671 Pain in right foot: Secondary | ICD-10-CM | POA: Diagnosis present

## 2021-10-12 DIAGNOSIS — Z79899 Other long term (current) drug therapy: Secondary | ICD-10-CM

## 2021-10-12 DIAGNOSIS — M79641 Pain in right hand: Secondary | ICD-10-CM | POA: Diagnosis present

## 2021-10-12 DIAGNOSIS — R748 Abnormal levels of other serum enzymes: Secondary | ICD-10-CM | POA: Diagnosis present

## 2021-10-12 NOTE — Telephone Encounter (Signed)
Patient advised FMLA paperwork has been completed and fax with confirmation. Patient advised her copy is at the front desk ready for pick up.

## 2021-10-13 LAB — COMPLETE METABOLIC PANEL WITH GFR
AG Ratio: 1.8 (calc) (ref 1.0–2.5)
ALT: 9 U/L (ref 6–29)
AST: 21 U/L (ref 10–35)
Albumin: 4.3 g/dL (ref 3.6–5.1)
Alkaline phosphatase (APISO): 123 U/L (ref 37–153)
BUN: 16 mg/dL (ref 7–25)
CO2: 31 mmol/L (ref 20–32)
Calcium: 9.6 mg/dL (ref 8.6–10.4)
Chloride: 105 mmol/L (ref 98–110)
Creat: 0.66 mg/dL (ref 0.50–1.03)
Globulin: 2.4 g/dL (calc) (ref 1.9–3.7)
Glucose, Bld: 72 mg/dL (ref 65–99)
Potassium: 4.2 mmol/L (ref 3.5–5.3)
Sodium: 143 mmol/L (ref 135–146)
Total Bilirubin: 0.6 mg/dL (ref 0.2–1.2)
Total Protein: 6.7 g/dL (ref 6.1–8.1)
eGFR: 105 mL/min/{1.73_m2} (ref >=60)

## 2021-10-13 LAB — CBC WITH DIFFERENTIAL/PLATELET
Absolute Monocytes: 291 cells/uL (ref 200–950)
Basophils Absolute: 10 cells/uL (ref 0–200)
Basophils Relative: 0.4 %
Eosinophils Absolute: 21 cells/uL (ref 15–500)
Eosinophils Relative: 0.8 %
HCT: 39.2 % (ref 35.0–45.0)
Hemoglobin: 12.6 g/dL (ref 11.7–15.5)
Lymphs Abs: 1134 cells/uL (ref 850–3900)
MCH: 29.7 pg (ref 27.0–33.0)
MCHC: 32.1 g/dL (ref 32.0–36.0)
MCV: 92.5 fL (ref 80.0–100.0)
MPV: 11.4 fL (ref 7.5–12.5)
Monocytes Relative: 11.2 %
Neutro Abs: 1144 cells/uL — ABNORMAL LOW (ref 1500–7800)
Neutrophils Relative %: 44 %
Platelets: 227 10*3/uL (ref 140–400)
RBC: 4.24 10*6/uL (ref 3.80–5.10)
RDW: 11.2 % (ref 11.0–15.0)
Total Lymphocyte: 43.6 %
WBC: 2.6 10*3/uL — ABNORMAL LOW (ref 3.8–10.8)

## 2021-10-13 NOTE — Progress Notes (Signed)
CMP WNL.  WBC count is borderline low-2.6-improving.  Absolute neutrophils are borderline low but improving.  Rest of CBC WNL.

## 2021-10-14 ENCOUNTER — Telehealth: Payer: Self-pay | Admitting: Rheumatology

## 2021-10-14 NOTE — Telephone Encounter (Signed)
Patient called the office requesting to speak with Seth Bake regarding her FMLA. Patient states she spoke with someone and Seth Bake needs to change something from 2 hours to 1 day and resubmit the paper work. Patient states Seth Bake should know what she is talking about. Advised patient Seth Bake is out of office and will return her call on Monday.

## 2021-10-17 NOTE — Telephone Encounter (Signed)
Attempted to contact the patient and left message to advise patient we only cover a couple of hours for an appointment not an entire day. Advised patient we would be unable to resubmit paperwork covering an entire day. Advised patient to call the office if she has any questions or concerns.

## 2021-10-20 NOTE — Progress Notes (Unsigned)
Pt came to the office but became upset about her no show fees (She has multiple) and she elected to leave rather than be seen. She will not be rescheduled.

## 2021-10-24 ENCOUNTER — Ambulatory Visit: Payer: Medicare Other | Admitting: Internal Medicine

## 2021-11-07 ENCOUNTER — Ambulatory Visit: Payer: Medicare Other | Admitting: Physician Assistant

## 2021-11-28 NOTE — Progress Notes (Unsigned)
Office Visit Note  Patient: [name]             Date of Birth: 03/24/1970           MRN: [id number]             PCP: Lorelee Market, MD Referring: Lorelee Market, MD Visit Date: 12/12/2021 Occupation: '@GUAROCC'$ @  Subjective:  Medication monitoring  History of Present Illness: [name] is a 51 y.o. female with history of seropositive rheumatoid arthritis. She is taking plaquenil 200 mg 1 tablet by mouth twice daily Monday through Friday.  She has been tolerating Plaquenil without any side effects on the increased dose.  She is scheduled for a baseline Plaquenil eye examination on 12/26/2021.  Patient denies any signs or symptoms of a rheumatoid arthritis flare since her last office visit.  She denies any joint swelling at this time.  She currently rates her pain in her hands a 1 out of 10.  She has not needed to take any over-the-counter products for pain relief.  She has not been on prednisone recently.  She denies any other joint pain or joint swelling at this time.  She denies any morning stiffness or nocturnal symptoms.  She denies any new medical conditions.  She has not had any recent infections.  She denies any other new questions or concerns.   Activities of Daily Living:  Patient reports morning stiffness for 0 minutes.   Patient Denies nocturnal pain.  Difficulty dressing/grooming: Denies Difficulty climbing stairs: Denies Difficulty getting out of chair: Denies Difficulty using hands for taps, buttons, cutlery, and/or writing: Denies  Review of Systems  Constitutional:  Negative for fatigue.  HENT:  Negative for mouth sores and mouth dryness.   Eyes:  Positive for visual disturbance. Negative for dryness.  Respiratory:  Negative for shortness of breath.   Cardiovascular:  Negative for chest pain and palpitations.  Gastrointestinal:  Negative for blood in stool, constipation and diarrhea.  Endocrine: Negative for increased urination.  Genitourinary:   Negative for involuntary urination.  Musculoskeletal:  Negative for joint pain, gait problem, joint pain, joint swelling, myalgias, muscle weakness, morning stiffness, muscle tenderness and myalgias.  Skin:  Negative for color change, rash, hair loss and sensitivity to sunlight.  Allergic/Immunologic: Negative for susceptible to infections.  Neurological:  Positive for dizziness. Negative for headaches.  Hematological:  Negative for swollen glands.  Psychiatric/Behavioral:  Negative for depressed mood and sleep disturbance. The patient is not nervous/anxious.     PMFS History:  Patient Active Problem List   Diagnosis Date Noted   Polyarthralgia 04/26/2021   Kidney stone 05/06/2012   Dense breasts 02/05/2012   Anemia    Anxiety    Arthritis    Hypertension    Liver mass    Ovarian cyst    Insomnia    H/O hypercholesterolemia    GERD (gastroesophageal reflux disease) 11/29/2010   Fibroadenoma of breast 11/11/2009    Past Medical History:  Diagnosis Date   Anemia    Anxiety    Arthritis    lower back   Depression    Endocervical polyp    h/o   Fibroadenoma of breast    left breast   Fibroids    h/o   GERD (gastroesophageal reflux disease)    on nexium-instructed to take dos   H/O hypercholesterolemia    History of right oophorectomy    Hypertension    controlled on hctz-b/p 142/93 at pat visit-will do ekg at  pat visit   Insomnia    Liver mass 12/2007   benign- per pt " no longer there"   Ovarian cyst    h/o    Family History  Problem Relation Age of Onset   Rheum arthritis Mother    Pancreatic cancer Maternal Grandmother    Past Surgical History:  Procedure Laterality Date   ABDOMINAL HYSTERECTOMY  11/29/2010   BILATERAL SALPINGECTOMY Bilateral 06/12/2012   Procedure: BILATERAL SALPINGECTOMY;  Surgeon: Eldred Manges, MD;  Location: [facility];  Service: Gynecology;  Laterality: Bilateral;   COLON SURGERY     ECTOPIC PREGNANCY SURGERY  1992   LAPAROSCOPIC  ASSISTED VAGINAL HYSTERECTOMY  11/29/2010   Procedure: LAPAROSCOPIC ASSISTED VAGINAL HYSTERECTOMY;  Surgeon: Eldred Manges, MD;  Location: [facility];  Service: Gynecology;  Laterality: N/A;   LAPAROSCOPY N/A 06/12/2012   Procedure: LAPAROSCOPY OPERATIVE;  Surgeon: Eldred Manges, MD;  Location: [facility];  Service: Gynecology;  Laterality: N/A;   MYOMECTOMY  2006   Social History   Social History Narrative   Not on file   Immunization History  Administered Date(s) Administered   PFIZER(Purple Top)SARS-COV-2 Vaccination 01/02/2020, 01/23/2020     Objective: Vital Signs: BP (!) 166/94 (BP Location: Left Arm, Patient Position: Sitting, Cuff Size: Normal)   Pulse 79   Resp 14   Ht 5' 0.5" (1.537 m)   Wt 137 lb 6.4 oz (62.3 kg)   LMP 11/03/2010   BMI 26.39 kg/m    Physical Exam Vitals and nursing note reviewed.  Constitutional:      Appearance: She is well-developed.  HENT:     Head: Normocephalic and atraumatic.  Eyes:     Conjunctiva/sclera: Conjunctivae normal.  Cardiovascular:     Rate and Rhythm: Normal rate and regular rhythm.     Heart sounds: Normal heart sounds.  Pulmonary:     Effort: Pulmonary effort is normal.     Breath sounds: Normal breath sounds.  Abdominal:     General: Bowel sounds are normal.     Palpations: Abdomen is soft.  Musculoskeletal:     Cervical back: Normal range of motion.  Skin:    General: Skin is warm and dry.     Capillary Refill: Capillary refill takes less than 2 seconds.  Neurological:     Mental Status: She is alert and oriented to person, place, and time.  Psychiatric:        Behavior: Behavior normal.      Musculoskeletal Exam: C-spine, thoracic spine, lumbar spine have good range of motion.  Shoulder joints, elbow joints, wrist joints, MCPs, PIPs, DIPs have good range of motion with no synovitis.  Complete fist formation bilaterally.  Hip joints have good range of motion with no groin pain.  Knee joints have good range of  motion with no warmth or effusion.  Ankle joints have good range of motion with no tenderness or joint swelling.  CDAI Exam: CDAI Score: -- Patient Global: 1 mm; Provider Global: 1 mm Swollen: --; Tender: -- Joint Exam 12/12/2021   No joint exam has been documented for this visit   There is currently no information documented on the homunculus. Go to the Rheumatology activity and complete the homunculus joint exam.  Investigation: No additional findings.  Imaging: No results found.  Recent Labs: Lab Results  Component Value Date   WBC 2.6 (L) 10/12/2021   HGB 12.6 10/12/2021   PLT 227 10/12/2021   NA 143 10/12/2021   K 4.2 10/12/2021  CL 105 10/12/2021   CO2 31 10/12/2021   GLUCOSE 72 10/12/2021   BUN 16 10/12/2021   CREATININE 0.66 10/12/2021   BILITOT 0.6 10/12/2021   ALKPHOS 107 10/02/2014   AST 21 10/12/2021   ALT 9 10/12/2021   PROT 6.7 10/12/2021   ALBUMIN 4.2 10/02/2014   CALCIUM 9.6 10/12/2021   GFRAA >60 10/02/2014   QFTBGOLDPLUS NEGATIVE 04/26/2021    Speciality Comments: No specialty comments available.  Procedures:  No procedures performed Allergies: Oxycodone-acetaminophen, Sulfa antibiotics, Tetracyclines & related, Hydrocodone, Oxycodone-acetaminophen, Shellfish allergy, and Tetracycline   Assessment / Plan:     Visit Diagnoses: Rheumatoid arthritis with rheumatoid factor of multiple sites without organ or systems involvement (HCC) - Positive RF, positive anti-CCP, elevated sed rate and synovitis: She has no synovitis on examination today.  She has not had any signs or symptoms of a rheumatoid arthritis flare.  She is not experiencing any morning stiffness or nocturnal pain.  No difficulty with ADLs.  She is currently taking Plaquenil 200 mg 1 tablet by mouth twice daily Monday through Friday.  She has been tolerating Plaquenil without any side effects.  She has not needed to take prednisone recently.  She is not using any over-the-counter products for  pain relief.  She is scheduled for a baseline Plaquenil eye examination on 12/26/2021.  She will remain on the current dose of Plaquenil.  She was advised to notify us if she develops signs or symptoms of a flare.  She will follow-up in the office in 3 months or sooner if needed.  High risk medication use - Plaquenil 200 mg 1 tablet by mouth twice daily Monday through Friday.   CBC and CMP updated on 10/12/21.  CBC and CMP were updated today.  Her next lab work will be due in 5 months. No plaquenil eye examination on file.  Patient is scheduled for a Plaquenil eye examination on 12/26/2021.  She was given a Plaquenil eye examination form to take with her to her upcoming appointment. No new medical conditions or medications.  No recent infections.  - Plan: COMPLETE METABOLIC PANEL WITH GFR, CBC with Differential/Platelet  Pain in both hands: No tenderness or synovitis noted on examination.  Complete fist formation bilaterally.  Chronic left shoulder pain - X-rays were unremarkable.  Good range of motion of the left shoulder with no discomfort at this time.  Pain in both feet - X-rays showed early osteoarthritic changes.  She is not experiencing any discomfort in her feet at this time.  Good range of motion of both ankle joints with no tenderness or synovitis.  Positive ANA (antinuclear antibody) - Low titer positive ANA, ENA negative.  ENA was negative and complements were normal.  No clinical features of systemic lupus.   Elevated CK - CK 206 on 04/26/21. No muscular weakness.  Other medical conditions are listed as follows:  Primary hypertension  H/O hypercholesterolemia  History of gastroesophageal reflux (GERD)  Kidney stone  Other insomnia  Liver mass - CT scan in 2016 showed benign liver lesion.  History of anxiety  History of sleep apnea  Family history of rheumatoid arthritis in mother  Orders: Orders Placed This Encounter  Procedures   COMPLETE METABOLIC PANEL WITH GFR    CBC with Differential/Platelet   Meds ordered this encounter  Medications   hydroxychloroquine (PLAQUENIL) 200 MG tablet    Sig: Take 1 tablet 200 mg BID Monday-Friday    Dispense:  40 tablet    Refill:  2  Follow-Up Instructions: Return in about 3 months (around 03/14/2022) for Rheumatoid arthritis.   Ofilia Neas, PA-C  Note - This record has been created using Dragon software.  Chart creation errors have been sought, but may not always  have been located. Such creation errors do not reflect on  the standard of medical care.

## 2021-12-12 ENCOUNTER — Encounter: Payer: Self-pay | Admitting: Physician Assistant

## 2021-12-12 ENCOUNTER — Ambulatory Visit: Payer: BC Managed Care – PPO | Attending: Physician Assistant | Admitting: Physician Assistant

## 2021-12-12 VITALS — BP 166/94 | HR 79 | Resp 14 | Ht 60.5 in | Wt 137.4 lb

## 2021-12-12 DIAGNOSIS — M79641 Pain in right hand: Secondary | ICD-10-CM | POA: Insufficient documentation

## 2021-12-12 DIAGNOSIS — Z8261 Family history of arthritis: Secondary | ICD-10-CM | POA: Diagnosis present

## 2021-12-12 DIAGNOSIS — I10 Essential (primary) hypertension: Secondary | ICD-10-CM | POA: Insufficient documentation

## 2021-12-12 DIAGNOSIS — R748 Abnormal levels of other serum enzymes: Secondary | ICD-10-CM | POA: Diagnosis present

## 2021-12-12 DIAGNOSIS — M25512 Pain in left shoulder: Secondary | ICD-10-CM | POA: Diagnosis present

## 2021-12-12 DIAGNOSIS — M79642 Pain in left hand: Secondary | ICD-10-CM | POA: Diagnosis present

## 2021-12-12 DIAGNOSIS — Z8719 Personal history of other diseases of the digestive system: Secondary | ICD-10-CM | POA: Diagnosis present

## 2021-12-12 DIAGNOSIS — Z8669 Personal history of other diseases of the nervous system and sense organs: Secondary | ICD-10-CM | POA: Insufficient documentation

## 2021-12-12 DIAGNOSIS — R160 Hepatomegaly, not elsewhere classified: Secondary | ICD-10-CM | POA: Insufficient documentation

## 2021-12-12 DIAGNOSIS — Z79899 Other long term (current) drug therapy: Secondary | ICD-10-CM | POA: Diagnosis present

## 2021-12-12 DIAGNOSIS — G8929 Other chronic pain: Secondary | ICD-10-CM | POA: Diagnosis present

## 2021-12-12 DIAGNOSIS — Z8659 Personal history of other mental and behavioral disorders: Secondary | ICD-10-CM | POA: Diagnosis present

## 2021-12-12 DIAGNOSIS — R768 Other specified abnormal immunological findings in serum: Secondary | ICD-10-CM | POA: Insufficient documentation

## 2021-12-12 DIAGNOSIS — Z8639 Personal history of other endocrine, nutritional and metabolic disease: Secondary | ICD-10-CM | POA: Diagnosis present

## 2021-12-12 DIAGNOSIS — M79672 Pain in left foot: Secondary | ICD-10-CM | POA: Diagnosis present

## 2021-12-12 DIAGNOSIS — M79671 Pain in right foot: Secondary | ICD-10-CM | POA: Diagnosis present

## 2021-12-12 DIAGNOSIS — M0579 Rheumatoid arthritis with rheumatoid factor of multiple sites without organ or systems involvement: Secondary | ICD-10-CM | POA: Insufficient documentation

## 2021-12-12 DIAGNOSIS — N200 Calculus of kidney: Secondary | ICD-10-CM | POA: Diagnosis present

## 2021-12-12 DIAGNOSIS — G4709 Other insomnia: Secondary | ICD-10-CM | POA: Insufficient documentation

## 2021-12-12 MED ORDER — HYDROXYCHLOROQUINE SULFATE 200 MG PO TABS: ORAL_TABLET | ORAL | 2 refills | Status: DC

## 2021-12-13 LAB — COMPLETE METABOLIC PANEL WITH GFR
AG Ratio: 1.5 (calc) (ref 1.0–2.5)
ALT: 16 U/L (ref 6–29)
AST: 24 U/L (ref 10–35)
Albumin: 4.2 g/dL (ref 3.6–5.1)
Alkaline phosphatase (APISO): 141 U/L (ref 37–153)
BUN: 14 mg/dL (ref 7–25)
CO2: 30 mmol/L (ref 20–32)
Calcium: 9.4 mg/dL (ref 8.6–10.4)
Chloride: 103 mmol/L (ref 98–110)
Creat: 0.69 mg/dL (ref 0.50–1.03)
Globulin: 2.8 g/dL (calc) (ref 1.9–3.7)
Glucose, Bld: 75 mg/dL (ref 65–99)
Potassium: 3.8 mmol/L (ref 3.5–5.3)
Sodium: 141 mmol/L (ref 135–146)
Total Bilirubin: 0.7 mg/dL (ref 0.2–1.2)
Total Protein: 7 g/dL (ref 6.1–8.1)
eGFR: 104 mL/min/{1.73_m2} (ref >=60)

## 2021-12-13 LAB — CBC WITH DIFFERENTIAL/PLATELET
Absolute Monocytes: 321 cells/uL (ref 200–950)
Basophils Absolute: 19 cells/uL (ref 0–200)
Basophils Relative: 0.7 %
Eosinophils Absolute: 30 cells/uL (ref 15–500)
Eosinophils Relative: 1.1 %
HCT: 38.6 % (ref 35.0–45.0)
Hemoglobin: 12.7 g/dL (ref 11.7–15.5)
Lymphs Abs: 1161 cells/uL (ref 850–3900)
MCH: 30 pg (ref 27.0–33.0)
MCHC: 32.9 g/dL (ref 32.0–36.0)
MCV: 91 fL (ref 80.0–100.0)
MPV: 11.7 fL (ref 7.5–12.5)
Monocytes Relative: 11.9 %
Neutro Abs: 1169 cells/uL — ABNORMAL LOW (ref 1500–7800)
Neutrophils Relative %: 43.3 %
Platelets: 238 10*3/uL (ref 140–400)
RBC: 4.24 10*6/uL (ref 3.80–5.10)
RDW: 11.3 % (ref 11.0–15.0)
Total Lymphocyte: 43 %
WBC: 2.7 10*3/uL — ABNORMAL LOW (ref 3.8–10.8)

## 2021-12-13 NOTE — Progress Notes (Signed)
WBC count remains low but continues to trend up.  Absolute neutrophils are low but improving.   Rest of CBC WNL.  CMP WNL

## 2022-03-02 NOTE — Progress Notes (Deleted)
Office Visit Note  Patient: [name]             Date of Birth: 05-13-1970           MRN: [id number]             PCP: Lorelee Market, MD Referring: Lorelee Market, MD Visit Date: 03/13/2022 Occupation: @GUAROCC$ @  Subjective:  No chief complaint on file.   History of Present Illness: [name] is a 52 y.o. female ***     Activities of Daily Living:  Patient reports morning stiffness for *** {minute/hour:19697}.   Patient {ACTIONS;DENIES/REPORTS:21021675::"Denies"} nocturnal pain.  Difficulty dressing/grooming: {ACTIONS;DENIES/REPORTS:21021675::"Denies"} Difficulty climbing stairs: {ACTIONS;DENIES/REPORTS:21021675::"Denies"} Difficulty getting out of chair: {ACTIONS;DENIES/REPORTS:21021675::"Denies"} Difficulty using hands for taps, buttons, cutlery, and/or writing: {ACTIONS;DENIES/REPORTS:21021675::"Denies"}  No Rheumatology ROS completed.   PMFS History:  Patient Active Problem List   Diagnosis Date Noted   Polyarthralgia 04/26/2021   Kidney stone 05/06/2012   Dense breasts 02/05/2012   Anemia    Anxiety    Arthritis    Hypertension    Liver mass    Ovarian cyst    Insomnia    H/O hypercholesterolemia    GERD (gastroesophageal reflux disease) 11/29/2010   Fibroadenoma of breast 11/11/2009    Past Medical History:  Diagnosis Date   Anemia    Anxiety    Arthritis    lower back   Depression    Endocervical polyp    h/o   Fibroadenoma of breast    left breast   Fibroids    h/o   GERD (gastroesophageal reflux disease)    on nexium-instructed to take dos   H/O hypercholesterolemia    History of right oophorectomy    Hypertension    controlled on hctz-b/p 142/93 at pat visit-will do ekg at pat visit   Insomnia    Liver mass 12/2007   benign- per pt " no longer there"   Ovarian cyst    h/o    Family History  Problem Relation Age of Onset   Rheum arthritis Mother    Pancreatic cancer Maternal Grandmother    Past Surgical  History:  Procedure Laterality Date   ABDOMINAL HYSTERECTOMY  11/29/2010   BILATERAL SALPINGECTOMY Bilateral 06/12/2012   Procedure: BILATERAL SALPINGECTOMY;  Surgeon: Eldred Manges, MD;  Location: [facility];  Service: Gynecology;  Laterality: Bilateral;   COLON SURGERY     ECTOPIC PREGNANCY SURGERY  1992   LAPAROSCOPIC ASSISTED VAGINAL HYSTERECTOMY  11/29/2010   Procedure: LAPAROSCOPIC ASSISTED VAGINAL HYSTERECTOMY;  Surgeon: Eldred Manges, MD;  Location: [facility];  Service: Gynecology;  Laterality: N/A;   LAPAROSCOPY N/A 06/12/2012   Procedure: LAPAROSCOPY OPERATIVE;  Surgeon: Eldred Manges, MD;  Location: [facility];  Service: Gynecology;  Laterality: N/A;   MYOMECTOMY  2006   Social History   Social History Narrative   Not on file   Immunization History  Administered Date(s) Administered   PFIZER(Purple Top)SARS-COV-2 Vaccination 01/02/2020, 01/23/2020     Objective: Vital Signs: LMP 11/03/2010    Physical Exam   Musculoskeletal Exam: ***  CDAI Exam: CDAI Score: -- Patient Global: --; Provider Global: -- Swollen: --; Tender: -- Joint Exam 03/13/2022   No joint exam has been documented for this visit   There is currently no information documented on the homunculus. Go to the Rheumatology activity and complete the homunculus joint exam.  Investigation: No additional findings.  Imaging: No results found.  Recent Labs: Lab Results  Component Value Date   WBC  2.7 (L) 12/12/2021   HGB 12.7 12/12/2021   PLT 238 12/12/2021   NA 141 12/12/2021   K 3.8 12/12/2021   CL 103 12/12/2021   CO2 30 12/12/2021   GLUCOSE 75 12/12/2021   BUN 14 12/12/2021   CREATININE 0.69 12/12/2021   BILITOT 0.7 12/12/2021   ALKPHOS 107 10/02/2014   AST 24 12/12/2021   ALT 16 12/12/2021   PROT 7.0 12/12/2021   ALBUMIN 4.2 10/02/2014   CALCIUM 9.4 12/12/2021   GFRAA >60 10/02/2014   QFTBGOLDPLUS NEGATIVE 04/26/2021    Speciality Comments: No specialty comments  available.  Procedures:  No procedures performed Allergies: Oxycodone-acetaminophen, Sulfa antibiotics, Tetracyclines & related, Hydrocodone, Oxycodone-acetaminophen, Shellfish allergy, and Tetracycline   Assessment / Plan:     Visit Diagnoses: No diagnosis found.  Orders: No orders of the defined types were placed in this encounter.  No orders of the defined types were placed in this encounter.   Face-to-face time spent with patient was *** minutes. Greater than 50% of time was spent in counseling and coordination of care.  Follow-Up Instructions: No follow-ups on file.   Earnestine Mealing, CMA  Note - This record has been created using Editor, commissioning.  Chart creation errors have been sought, but may not always  have been located. Such creation errors do not reflect on  the standard of medical care.

## 2022-03-13 ENCOUNTER — Ambulatory Visit: Payer: BC Managed Care – PPO | Admitting: Physician Assistant

## 2022-03-13 DIAGNOSIS — R768 Other specified abnormal immunological findings in serum: Secondary | ICD-10-CM

## 2022-03-13 DIAGNOSIS — M0579 Rheumatoid arthritis with rheumatoid factor of multiple sites without organ or systems involvement: Secondary | ICD-10-CM

## 2022-03-13 DIAGNOSIS — N200 Calculus of kidney: Secondary | ICD-10-CM

## 2022-03-13 DIAGNOSIS — M79671 Pain in right foot: Secondary | ICD-10-CM

## 2022-03-13 DIAGNOSIS — M79641 Pain in right hand: Secondary | ICD-10-CM

## 2022-03-13 DIAGNOSIS — G4709 Other insomnia: Secondary | ICD-10-CM

## 2022-03-13 DIAGNOSIS — Z8639 Personal history of other endocrine, nutritional and metabolic disease: Secondary | ICD-10-CM

## 2022-03-13 DIAGNOSIS — R160 Hepatomegaly, not elsewhere classified: Secondary | ICD-10-CM

## 2022-03-13 DIAGNOSIS — G8929 Other chronic pain: Secondary | ICD-10-CM

## 2022-03-13 DIAGNOSIS — Z8261 Family history of arthritis: Secondary | ICD-10-CM

## 2022-03-13 DIAGNOSIS — Z79899 Other long term (current) drug therapy: Secondary | ICD-10-CM

## 2022-03-13 DIAGNOSIS — Z8719 Personal history of other diseases of the digestive system: Secondary | ICD-10-CM

## 2022-03-13 DIAGNOSIS — Z8669 Personal history of other diseases of the nervous system and sense organs: Secondary | ICD-10-CM

## 2022-03-13 DIAGNOSIS — R748 Abnormal levels of other serum enzymes: Secondary | ICD-10-CM

## 2022-03-13 DIAGNOSIS — Z8659 Personal history of other mental and behavioral disorders: Secondary | ICD-10-CM

## 2022-03-13 DIAGNOSIS — I10 Essential (primary) hypertension: Secondary | ICD-10-CM

## 2022-04-07 ENCOUNTER — Other Ambulatory Visit: Payer: Self-pay | Admitting: Physician Assistant

## 2022-04-07 NOTE — Telephone Encounter (Signed)
Next Visit: 04/24/2022  Last Visit: 12/12/2021  Labs: 02/01/2022 Potassium 3.3, Creat. 0.56, WBC 3.4  Eye exam: not on file   Current Dose per office note 12/12/2021: Plaquenil 200 mg 1 tablet by mouth twice daily Monday through Friday.   DX: Rheumatoid arthritis with rheumatoid factor of multiple sites without organ or systems involvement   Last Fill: 12/12/2021  Spoke with patient and advised she needs to schedule PLQ eye exam. Patient will call to advise when appointment is scheduled for.   Okay to refill Plaquenil?

## 2022-04-10 NOTE — Progress Notes (Deleted)
Office Visit Note  Patient: [name]             Date of Birth: 11/07/1970           MRN: [id number]             PCP: Lorelee Market, MD Referring: Lorelee Market, MD Visit Date: 04/24/2022 Occupation: @GUAROCC$ @  Subjective:  No chief complaint on file.   History of Present Illness: [name] is a 52 y.o. female ***     Activities of Daily Living:  Patient reports morning stiffness for *** {minute/hour:19697}.   Patient {ACTIONS;DENIES/REPORTS:21021675::"Denies"} nocturnal pain.  Difficulty dressing/grooming: {ACTIONS;DENIES/REPORTS:21021675::"Denies"} Difficulty climbing stairs: {ACTIONS;DENIES/REPORTS:21021675::"Denies"} Difficulty getting out of chair: {ACTIONS;DENIES/REPORTS:21021675::"Denies"} Difficulty using hands for taps, buttons, cutlery, and/or writing: {ACTIONS;DENIES/REPORTS:21021675::"Denies"}  No Rheumatology ROS completed.   PMFS History:  Patient Active Problem List   Diagnosis Date Noted   Polyarthralgia 04/26/2021   Kidney stone 05/06/2012   Dense breasts 02/05/2012   Anemia    Anxiety    Arthritis    Hypertension    Liver mass    Ovarian cyst    Insomnia    H/O hypercholesterolemia    GERD (gastroesophageal reflux disease) 11/29/2010   Fibroadenoma of breast 11/11/2009    Past Medical History:  Diagnosis Date   Anemia    Anxiety    Arthritis    lower back   Depression    Endocervical polyp    h/o   Fibroadenoma of breast    left breast   Fibroids    h/o   GERD (gastroesophageal reflux disease)    on nexium-instructed to take dos   H/O hypercholesterolemia    History of right oophorectomy    Hypertension    controlled on hctz-b/p 142/93 at pat visit-will do ekg at pat visit   Insomnia    Liver mass 12/2007   benign- per pt " no longer there"   Ovarian cyst    h/o    Family History  Problem Relation Age of Onset   Rheum arthritis Mother    Pancreatic cancer Maternal Grandmother    Past Surgical  History:  Procedure Laterality Date   ABDOMINAL HYSTERECTOMY  11/29/2010   BILATERAL SALPINGECTOMY Bilateral 06/12/2012   Procedure: BILATERAL SALPINGECTOMY;  Surgeon: Eldred Manges, MD;  Location: [facility];  Service: Gynecology;  Laterality: Bilateral;   COLON SURGERY     ECTOPIC PREGNANCY SURGERY  1992   LAPAROSCOPIC ASSISTED VAGINAL HYSTERECTOMY  11/29/2010   Procedure: LAPAROSCOPIC ASSISTED VAGINAL HYSTERECTOMY;  Surgeon: Eldred Manges, MD;  Location: [facility];  Service: Gynecology;  Laterality: N/A;   LAPAROSCOPY N/A 06/12/2012   Procedure: LAPAROSCOPY OPERATIVE;  Surgeon: Eldred Manges, MD;  Location: [facility];  Service: Gynecology;  Laterality: N/A;   MYOMECTOMY  2006   Social History   Social History Narrative   Not on file   Immunization History  Administered Date(s) Administered   PFIZER(Purple Top)SARS-COV-2 Vaccination 01/02/2020, 01/23/2020     Objective: Vital Signs: LMP 11/03/2010    Physical Exam   Musculoskeletal Exam: ***  CDAI Exam: CDAI Score: -- Patient Global: --; Provider Global: -- Swollen: --; Tender: -- Joint Exam 04/24/2022   No joint exam has been documented for this visit   There is currently no information documented on the homunculus. Go to the Rheumatology activity and complete the homunculus joint exam.  Investigation: No additional findings.  Imaging: No results found.  Recent Labs: Lab Results  Component Value Date   WBC  2.7 (L) 12/12/2021   HGB 12.7 12/12/2021   PLT 238 12/12/2021   NA 141 12/12/2021   K 3.8 12/12/2021   CL 103 12/12/2021   CO2 30 12/12/2021   GLUCOSE 75 12/12/2021   BUN 14 12/12/2021   CREATININE 0.69 12/12/2021   BILITOT 0.7 12/12/2021   ALKPHOS 107 10/02/2014   AST 24 12/12/2021   ALT 16 12/12/2021   PROT 7.0 12/12/2021   ALBUMIN 4.2 10/02/2014   CALCIUM 9.4 12/12/2021   GFRAA >60 10/02/2014   QFTBGOLDPLUS NEGATIVE 04/26/2021    Speciality Comments: Appt scheduled - PLQ eye exam 05/11/2022  at 1;30 pm at [facility] per patient  Procedures:  No procedures performed Allergies: Oxycodone-acetaminophen, Sulfa antibiotics, Tetracyclines & related, Hydrocodone, Oxycodone-acetaminophen, Shellfish allergy, and Tetracycline   Assessment / Plan:     Visit Diagnoses: Rheumatoid arthritis with rheumatoid factor of multiple sites without organ or systems involvement (HCC)  High risk medication use  Pain in both hands  Chronic left shoulder pain  Pain in both feet  Positive ANA (antinuclear antibody)  Elevated CK  Primary hypertension  H/O hypercholesterolemia  History of gastroesophageal reflux (GERD)  Kidney stone  Other insomnia  History of anxiety  History of sleep apnea  Family history of rheumatoid arthritis in mother  Orders: No orders of the defined types were placed in this encounter.  No orders of the defined types were placed in this encounter.   Face-to-face time spent with patient was *** minutes. Greater than 50% of time was spent in counseling and coordination of care.  Follow-Up Instructions: No follow-ups on file.   Ofilia Neas, PA-C  Note - This record has been created using Dragon software.  Chart creation errors have been sought, but may not always  have been located. Such creation errors do not reflect on  the standard of medical care.

## 2022-04-24 ENCOUNTER — Ambulatory Visit: Payer: BC Managed Care – PPO | Admitting: Physician Assistant

## 2022-04-24 DIAGNOSIS — G4709 Other insomnia: Secondary | ICD-10-CM

## 2022-04-24 DIAGNOSIS — R748 Abnormal levels of other serum enzymes: Secondary | ICD-10-CM

## 2022-04-24 DIAGNOSIS — M0579 Rheumatoid arthritis with rheumatoid factor of multiple sites without organ or systems involvement: Secondary | ICD-10-CM

## 2022-04-24 DIAGNOSIS — Z8261 Family history of arthritis: Secondary | ICD-10-CM

## 2022-04-24 DIAGNOSIS — M79671 Pain in right foot: Secondary | ICD-10-CM

## 2022-04-24 DIAGNOSIS — Z8659 Personal history of other mental and behavioral disorders: Secondary | ICD-10-CM

## 2022-04-24 DIAGNOSIS — G8929 Other chronic pain: Secondary | ICD-10-CM

## 2022-04-24 DIAGNOSIS — Z8719 Personal history of other diseases of the digestive system: Secondary | ICD-10-CM

## 2022-04-24 DIAGNOSIS — Z79899 Other long term (current) drug therapy: Secondary | ICD-10-CM

## 2022-04-24 DIAGNOSIS — Z8639 Personal history of other endocrine, nutritional and metabolic disease: Secondary | ICD-10-CM

## 2022-04-24 DIAGNOSIS — R768 Other specified abnormal immunological findings in serum: Secondary | ICD-10-CM

## 2022-04-24 DIAGNOSIS — N200 Calculus of kidney: Secondary | ICD-10-CM

## 2022-04-24 DIAGNOSIS — Z8669 Personal history of other diseases of the nervous system and sense organs: Secondary | ICD-10-CM

## 2022-04-24 DIAGNOSIS — M79641 Pain in right hand: Secondary | ICD-10-CM

## 2022-04-24 DIAGNOSIS — I10 Essential (primary) hypertension: Secondary | ICD-10-CM

## 2022-04-25 NOTE — Progress Notes (Signed)
Office Visit Note  Patient: [name]             Date of Birth: Sep 05, 1970           MRN: [id number]             PCP: Lorelee Market, MD Referring: Lorelee Market, MD Visit Date: 05/08/2022 Occupation: '@GUAROCC'$ @  Subjective:  Medication monitoring   History of Present Illness: [name] is a 52 y.o. female with history of seropositive rheumatoid arthritis.  Patient is prescribed plaquenil 200 mg 1 tablet by mouth twice daily Monday through Friday.  Patient reports that she has been off of Plaquenil for the past 1 month due to recent infection.  Patient states that while taking Plaquenil she noticed some GI upset.  She would like to try reducing the dose of Plaquenil.  Patient denies any signs or symptoms of a recent rheumatoid arthritis flare.  She is not currently experiencing any morning stiffness or nocturnal pain.  She denies any joint swelling at this time.   Activities of Daily Living:  Patient denies any morning stiffness  Patient Denies nocturnal pain.  Difficulty dressing/grooming: Denies Difficulty climbing stairs: Denies Difficulty getting out of chair: Denies Difficulty using hands for taps, buttons, cutlery, and/or writing: Denies  Review of Systems  Constitutional:  Negative for fatigue.  HENT:  Negative for mouth sores and mouth dryness.   Eyes:  Negative for dryness.  Respiratory:  Negative for shortness of breath.   Cardiovascular:  Negative for chest pain and palpitations.  Gastrointestinal:  Negative for blood in stool, constipation and diarrhea.  Endocrine: Negative for increased urination.  Genitourinary:  Negative for involuntary urination.  Musculoskeletal:  Negative for joint pain, gait problem, joint pain, joint swelling, myalgias, muscle weakness, morning stiffness, muscle tenderness and myalgias.  Skin:  Negative for color change, rash, hair loss and sensitivity to sunlight.  Allergic/Immunologic: Negative for susceptible to  infections.  Neurological:  Negative for dizziness and headaches.  Hematological:  Negative for swollen glands.  Psychiatric/Behavioral:  Negative for depressed mood and sleep disturbance. The patient is not nervous/anxious.     PMFS History:  Patient Active Problem List   Diagnosis Date Noted   Polyarthralgia 04/26/2021   Kidney stone 05/06/2012   Dense breasts 02/05/2012   Anemia    Anxiety    Arthritis    Hypertension    Liver mass    Ovarian cyst    Insomnia    H/O hypercholesterolemia    GERD (gastroesophageal reflux disease) 11/29/2010   Fibroadenoma of breast 11/11/2009    Past Medical History:  Diagnosis Date   Anemia    Anxiety    Arthritis    lower back   Depression    Endocervical polyp    h/o   Fibroadenoma of breast    left breast   Fibroids    h/o   GERD (gastroesophageal reflux disease)    on nexium-instructed to take dos   H/O hypercholesterolemia    History of right oophorectomy    Hypertension    controlled on hctz-b/p 142/93 at pat visit-will do ekg at pat visit   Insomnia    Liver mass 12/2007   benign- per pt " no longer there"   Ovarian cyst    h/o    Family History  Problem Relation Age of Onset   Rheum arthritis Mother    Pancreatic cancer Maternal Grandmother    Past Surgical History:  Procedure Laterality Date  ABDOMINAL HYSTERECTOMY  11/29/2010   BILATERAL SALPINGECTOMY Bilateral 06/12/2012   Procedure: BILATERAL SALPINGECTOMY;  Surgeon: Eldred Manges, MD;  Location: [facility];  Service: Gynecology;  Laterality: Bilateral;   COLON SURGERY     ECTOPIC PREGNANCY SURGERY  1992   LAPAROSCOPIC ASSISTED VAGINAL HYSTERECTOMY  11/29/2010   Procedure: LAPAROSCOPIC ASSISTED VAGINAL HYSTERECTOMY;  Surgeon: Eldred Manges, MD;  Location: [facility];  Service: Gynecology;  Laterality: N/A;   LAPAROSCOPY N/A 06/12/2012   Procedure: LAPAROSCOPY OPERATIVE;  Surgeon: Eldred Manges, MD;  Location: [facility];  Service: Gynecology;  Laterality:  N/A;   MYOMECTOMY  2006   Social History   Social History Narrative   Not on file   Immunization History  Administered Date(s) Administered   PFIZER(Purple Top)SARS-COV-2 Vaccination 01/02/2020, 01/23/2020     Objective: Vital Signs: BP (!) 157/94 (BP Location: Right Arm, Patient Position: Sitting, Cuff Size: Normal)   Pulse 88   Ht 5' (1.524 m)   Wt 133 lb (60.3 kg)   LMP 11/03/2010   BMI 25.97 kg/m    Physical Exam Vitals and nursing note reviewed.  Constitutional:      Appearance: She is well-developed.  HENT:     Head: Normocephalic and atraumatic.  Eyes:     Conjunctiva/sclera: Conjunctivae normal.  Cardiovascular:     Rate and Rhythm: Normal rate and regular rhythm.     Heart sounds: Normal heart sounds.  Pulmonary:     Effort: Pulmonary effort is normal.     Breath sounds: Normal breath sounds.  Abdominal:     General: Bowel sounds are normal.     Palpations: Abdomen is soft.  Musculoskeletal:     Cervical back: Normal range of motion.  Skin:    General: Skin is warm and dry.     Capillary Refill: Capillary refill takes less than 2 seconds.  Neurological:     Mental Status: She is alert and oriented to person, place, and time.  Psychiatric:        Behavior: Behavior normal.      Musculoskeletal Exam: C-spine, thoracic spine, lumbar spine have good range of motion.  No midline spinal tenderness.  Shoulder joints, elbow joints, wrist joints, MCPs, PIPs, DIPs have good range of motion with no synovitis.  Complete fist formation bilaterally.  Hip joints have good range of motion with no groin pain.  Knee joints have good range of motion with no warmth or effusion.  Ankle joints have good range of motion with no tenderness or joint swelling.  CDAI Exam: CDAI Score: -- Patient Global: 0 mm; Provider Global: 0 mm Swollen: --; Tender: -- Joint Exam 05/08/2022   No joint exam has been documented for this visit   There is currently no information documented  on the homunculus. Go to the Rheumatology activity and complete the homunculus joint exam.  Investigation: No additional findings.  Imaging: No results found.  Recent Labs: Lab Results  Component Value Date   WBC 2.7 (L) 12/12/2021   HGB 12.7 12/12/2021   PLT 238 12/12/2021   NA 141 12/12/2021   K 3.8 12/12/2021   CL 103 12/12/2021   CO2 30 12/12/2021   GLUCOSE 75 12/12/2021   BUN 14 12/12/2021   CREATININE 0.69 12/12/2021   BILITOT 0.7 12/12/2021   ALKPHOS 107 10/02/2014   AST 24 12/12/2021   ALT 16 12/12/2021   PROT 7.0 12/12/2021   ALBUMIN 4.2 10/02/2014   CALCIUM 9.4 12/12/2021   GFRAA >60 10/02/2014  QFTBGOLDPLUS NEGATIVE 04/26/2021    Speciality Comments: Appt scheduled - PLQ eye exam 05/11/2022 at 1;30 pm at [facility] per patient  Procedures:  No procedures performed Allergies: Oxycodone-acetaminophen, Sulfa antibiotics, Tetracyclines & related, Hydrocodone, Oxycodone-acetaminophen, Shellfish allergy, and Tetracycline   Assessment / Plan:     Visit Diagnoses: Rheumatoid arthritis with rheumatoid factor of multiple sites without organ or systems involvement (HCC) - Positive RF, positive anti-CCP, elevated sed rate and synovitis: She has no synovitis on examination today.  She has not had any signs or symptoms of a rheumatoid arthritis flare.  She has been off of Plaquenil for the past 1 month due to a recent infection.  While taking Plaquenil she noticed mild GI side effects.  She would like to try reducing the dose of Plaquenil.  Her Plaquenil prescription will be changed to 200 mg 1 tablet by mouth daily.  She was advised to notify us if she continues to have GI intolerance.  She will follow-up in the office in 3 months or sooner if needed.  High risk medication use - Plaquenil 200 mg 1 tablet by mouth once daily.  CBC and CMP updated on 02/01/22.  Orders for CBC and CMP were released today. She is scheduled for Plaquenil eye examination on 05/11/2022.  She  she will ensure the Plaquenil eye examination is return to our office to review. - Plan: CBC with Differential/Platelet, COMPLETE METABOLIC PANEL WITH GFR  Chronic left shoulder pain - Unremarkable XR.  She has good range of motion of the left shoulder joint on examination today.  Pain in both feet: She is not experiencing discomfort in her feet at this time.  Positive ANA (antinuclear antibody) - Low titer positive ANA, ENA negative.  ENA was negative and complements were normal.  No clinical features of systemic lupus.  Elevated CK: CK was 206 on 04/26/2021.  Future order for CK remains in place.  She is not experiencing muscle weakness at this time.  Other medical conditions are listed as follows:  Primary hypertension: Blood pressure was elevated at 157/94 today.  Blood pressure was rechecked prior to the patient leaving.  She was advised to monitor blood pressure closely.  H/O hypercholesterolemia  History of gastroesophageal reflux (GERD)  Kidney stone  Other insomnia  Liver mass  History of anxiety  History of sleep apnea  Family history of rheumatoid arthritis in mother  Orders: Orders Placed This Encounter  Procedures   CBC with Differential/Platelet   COMPLETE METABOLIC PANEL WITH GFR   Meds ordered this encounter  Medications   hydroxychloroquine (PLAQUENIL) 200 MG tablet    Sig: Take 1 tablet (200 mg total) by mouth daily.    Dispense:  90 tablet    Refill:  0     Follow-Up Instructions: Return in about 3 months (around 08/08/2022) for Rheumatoid arthritis.   Ofilia Neas, PA-C  Note - This record has been created using Dragon software.  Chart creation errors have been sought, but may not always  have been located. Such creation errors do not reflect on  the standard of medical care.

## 2022-05-08 ENCOUNTER — Ambulatory Visit: Payer: BC Managed Care – PPO | Attending: Physician Assistant | Admitting: Physician Assistant

## 2022-05-08 ENCOUNTER — Encounter: Payer: Self-pay | Admitting: Physician Assistant

## 2022-05-08 VITALS — BP 157/94 | HR 88 | Ht 60.0 in | Wt 133.0 lb

## 2022-05-08 DIAGNOSIS — G8929 Other chronic pain: Secondary | ICD-10-CM

## 2022-05-08 DIAGNOSIS — G4709 Other insomnia: Secondary | ICD-10-CM

## 2022-05-08 DIAGNOSIS — M79671 Pain in right foot: Secondary | ICD-10-CM

## 2022-05-08 DIAGNOSIS — N200 Calculus of kidney: Secondary | ICD-10-CM

## 2022-05-08 DIAGNOSIS — Z8719 Personal history of other diseases of the digestive system: Secondary | ICD-10-CM

## 2022-05-08 DIAGNOSIS — Z8261 Family history of arthritis: Secondary | ICD-10-CM

## 2022-05-08 DIAGNOSIS — Z8639 Personal history of other endocrine, nutritional and metabolic disease: Secondary | ICD-10-CM

## 2022-05-08 DIAGNOSIS — I10 Essential (primary) hypertension: Secondary | ICD-10-CM

## 2022-05-08 DIAGNOSIS — Z79899 Other long term (current) drug therapy: Secondary | ICD-10-CM

## 2022-05-08 DIAGNOSIS — M25512 Pain in left shoulder: Secondary | ICD-10-CM

## 2022-05-08 DIAGNOSIS — Z8659 Personal history of other mental and behavioral disorders: Secondary | ICD-10-CM

## 2022-05-08 DIAGNOSIS — R748 Abnormal levels of other serum enzymes: Secondary | ICD-10-CM

## 2022-05-08 DIAGNOSIS — R160 Hepatomegaly, not elsewhere classified: Secondary | ICD-10-CM

## 2022-05-08 DIAGNOSIS — R768 Other specified abnormal immunological findings in serum: Secondary | ICD-10-CM

## 2022-05-08 DIAGNOSIS — M0579 Rheumatoid arthritis with rheumatoid factor of multiple sites without organ or systems involvement: Secondary | ICD-10-CM | POA: Diagnosis not present

## 2022-05-08 DIAGNOSIS — M79672 Pain in left foot: Secondary | ICD-10-CM

## 2022-05-08 DIAGNOSIS — Z8669 Personal history of other diseases of the nervous system and sense organs: Secondary | ICD-10-CM

## 2022-05-08 LAB — CBC WITH DIFFERENTIAL/PLATELET
Absolute Monocytes: 298 cells/uL (ref 200–950)
Basophils Absolute: 9 cells/uL (ref 0–200)
Basophils Relative: 0.3 %
Eosinophils Absolute: 31 cells/uL (ref 15–500)
Eosinophils Relative: 1 %
HCT: 36.4 % (ref 35.0–45.0)
Hemoglobin: 11.7 g/dL (ref 11.7–15.5)
Lymphs Abs: 1324 cells/uL (ref 850–3900)
MCH: 29 pg (ref 27.0–33.0)
MCHC: 32.1 g/dL (ref 32.0–36.0)
MCV: 90.1 fL (ref 80.0–100.0)
MPV: 11.7 fL (ref 7.5–12.5)
Monocytes Relative: 9.6 %
Neutro Abs: 1438 cells/uL — ABNORMAL LOW (ref 1500–7800)
Neutrophils Relative %: 46.4 %
Platelets: 234 10*3/uL (ref 140–400)
RBC: 4.04 10*6/uL (ref 3.80–5.10)
RDW: 11.4 % (ref 11.0–15.0)
Total Lymphocyte: 42.7 %
WBC: 3.1 10*3/uL — ABNORMAL LOW (ref 3.8–10.8)

## 2022-05-08 LAB — COMPLETE METABOLIC PANEL WITH GFR
AG Ratio: 1.7 (calc) (ref 1.0–2.5)
ALT: 9 U/L (ref 6–29)
AST: 19 U/L (ref 10–35)
Albumin: 4.4 g/dL (ref 3.6–5.1)
Alkaline phosphatase (APISO): 117 U/L (ref 37–153)
BUN: 13 mg/dL (ref 7–25)
CO2: 30 mmol/L (ref 20–32)
Calcium: 9.6 mg/dL (ref 8.6–10.4)
Chloride: 105 mmol/L (ref 98–110)
Creat: 0.67 mg/dL (ref 0.50–1.03)
Globulin: 2.6 g/dL (calc) (ref 1.9–3.7)
Glucose, Bld: 69 mg/dL (ref 65–99)
Potassium: 4.1 mmol/L (ref 3.5–5.3)
Sodium: 142 mmol/L (ref 135–146)
Total Bilirubin: 0.6 mg/dL (ref 0.2–1.2)
Total Protein: 7 g/dL (ref 6.1–8.1)
eGFR: 104 mL/min/{1.73_m2} (ref >=60)

## 2022-05-08 MED ORDER — HYDROXYCHLOROQUINE SULFATE 200 MG PO TABS: 200.0000 mg | ORAL_TABLET | Freq: Every day | ORAL | 0 refills | Status: DC

## 2022-05-09 NOTE — Progress Notes (Signed)
CMP WNL WBC count remains low but is trending up. Absolute neutrophils remain low but are trending up.  We will continue to monitor.

## 2022-06-30 ENCOUNTER — Telehealth: Payer: Self-pay

## 2022-06-30 NOTE — Telephone Encounter (Signed)
Dr. Corliss Skains and I recommend having leave of absence paperwork filled out by PCP. This is not paperwork we usually fill out.

## 2022-06-30 NOTE — Telephone Encounter (Signed)
Patient called requesting to speak to [facility], but made aware she was currently with patients. Patient is requesting a call back on the status for her leave of absence paperwork.   Please advise.

## 2022-06-30 NOTE — Telephone Encounter (Signed)
Returned call to patient. She states she is taking a LOA from her job due to an increased amount of stress at work. Patient states she is having an extreme amount of exhaustion and fatigue. Patient states she is going to take this time to get her medication regulated and to manage her stress. She is also talking with her PCP about this. Patient states she is also being harassed at work which is adding to her stress level. Patient states she is intending on starting her LOA on Jul 03, 2022 and returning to work on October 27, 2022. Patient is having paperwork sent to our office and would like for [place] to complete this for her LOA. Please advise.

## 2022-07-03 NOTE — Telephone Encounter (Signed)
Patient advised Dr. Corliss Skains and Ladona Ridgel recommended having leave of absence paperwork filled out by PCP. This is not paperwork we usually fill out. Patient expressed understanding.

## 2022-07-25 NOTE — Progress Notes (Signed)
Office Visit Note  Patient: [name]             Date of Birth: [birth year]           MRN: [id number]             PCP: Evelene Croon, MD Referring: Evelene Croon, MD Visit Date: 08/07/2022 Occupation: @GUAROCC @  Subjective:  Medication monitoring   History of Present Illness: [name] is a 52 y.o. female with history of seropositive rheumatoid arthritis.  Patient remains on Plaquenil 200 mg 1 tablet by mouth daily.  She has been tolerating Plaquenil without any side effects.  She has not missed any doses recently.  Patient states that she has intermittent stiffness in both hands and her right wrist but denies any increased joint pain or joint swelling at this time.  She has not had any nocturnal pain.  No difficulty with ADLs.  Patient states that she is currently on leave from work until at least mid September 2024.  Patient is planning on focusing on her health and wellness this summer prior to returning to work.  She is also currently grieving the loss of her father.  Activities of Daily Living:  Patient reports morning stiffness for 0 minutes.   Patient Denies nocturnal pain.  Difficulty dressing/grooming: Denies Difficulty climbing stairs: Denies Difficulty getting out of chair: Denies Difficulty using hands for taps, buttons, cutlery, and/or writing: Denies  Review of Systems  Constitutional:  Positive for fatigue.  HENT:  Negative for mouth sores and mouth dryness.   Eyes:  Negative for dryness.  Respiratory:  Negative for shortness of breath.   Cardiovascular:  Negative for chest pain and palpitations.  Gastrointestinal:  Negative for blood in stool, constipation and diarrhea.  Endocrine: Negative for increased urination.  Genitourinary:  Negative for involuntary urination.  Musculoskeletal:  Negative for joint pain, gait problem, joint pain, joint swelling, myalgias, muscle weakness, morning stiffness, muscle tenderness and myalgias.  Skin:   Negative for color change, rash, hair loss and sensitivity to sunlight.  Allergic/Immunologic: Negative for susceptible to infections.  Neurological:  Negative for dizziness and headaches.  Hematological:  Negative for swollen glands.  Psychiatric/Behavioral:  Positive for sleep disturbance. Negative for depressed mood. The patient is nervous/anxious.     PMFS History:  Patient Active Problem List   Diagnosis Date Noted   Polyarthralgia 04/26/2021   Kidney stone 05/06/2012   Dense breasts 02/05/2012   Anemia    Anxiety    Arthritis    Hypertension    Liver mass    Ovarian cyst    Insomnia    H/O hypercholesterolemia    GERD (gastroesophageal reflux disease) 11/29/2010   Fibroadenoma of breast 11/11/2009    Past Medical History:  Diagnosis Date   Anemia    Anxiety    Arthritis    lower back   Depression    Endocervical polyp    h/o   Fibroadenoma of breast    left breast   Fibroids    h/o   GERD (gastroesophageal reflux disease)    on nexium-instructed to take dos   H/O hypercholesterolemia    History of right oophorectomy    Hypertension    controlled on hctz-b/p 142/93 at pat visit-will do ekg at pat visit   Insomnia    Liver mass 12/2007   benign- per pt " no longer there"   Ovarian cyst    h/o    Family History  Problem Relation Age  of Onset   Rheum arthritis Mother    Heart attack Father    Pancreatic cancer Maternal Grandmother    Past Surgical History:  Procedure Laterality Date   ABDOMINAL HYSTERECTOMY  11/29/2010   BILATERAL SALPINGECTOMY Bilateral 06/12/2012   Procedure: BILATERAL SALPINGECTOMY;  Surgeon: Hal Morales, MD;  Location: [facility];  Service: Gynecology;  Laterality: Bilateral;   COLON SURGERY     ECTOPIC PREGNANCY SURGERY  1992   LAPAROSCOPIC ASSISTED VAGINAL HYSTERECTOMY  11/29/2010   Procedure: LAPAROSCOPIC ASSISTED VAGINAL HYSTERECTOMY;  Surgeon: Hal Morales, MD;  Location: [facility];  Service: Gynecology;  Laterality: N/A;    LAPAROSCOPY N/A 06/12/2012   Procedure: LAPAROSCOPY OPERATIVE;  Surgeon: Hal Morales, MD;  Location: [facility];  Service: Gynecology;  Laterality: N/A;   MYOMECTOMY  2006   Social History   Social History Narrative   Not on file   Immunization History  Administered Date(s) Administered   PFIZER(Purple Top)SARS-COV-2 Vaccination 01/02/2020, 01/23/2020     Objective: Vital Signs: BP (!) 152/97 (BP Location: Left Arm, Patient Position: Sitting, Cuff Size: Normal)   Pulse 77   Resp 13   Ht 4\' 11"  (1.499 m)   Wt 129 lb 9.6 oz (58.8 kg)   LMP 11/03/2010   BMI 26.18 kg/m    Physical Exam Vitals and nursing note reviewed.  Constitutional:      Appearance: She is well-developed.  HENT:     Head: Normocephalic and atraumatic.  Eyes:     Conjunctiva/sclera: Conjunctivae normal.  Cardiovascular:     Rate and Rhythm: Normal rate and regular rhythm.     Heart sounds: Normal heart sounds.  Pulmonary:     Effort: Pulmonary effort is normal.     Breath sounds: Normal breath sounds.  Abdominal:     General: Bowel sounds are normal.     Palpations: Abdomen is soft.  Musculoskeletal:     Cervical back: Normal range of motion.  Lymphadenopathy:     Cervical: No cervical adenopathy.  Skin:    General: Skin is warm and dry.     Capillary Refill: Capillary refill takes less than 2 seconds.  Neurological:     Mental Status: She is alert and oriented to person, place, and time.  Psychiatric:        Behavior: Behavior normal.      Musculoskeletal Exam: C-spine, thoracic spine, lumbar spine have good range of motion.  Shoulder joints, elbow joints, wrist joints, MCPs, PIPs, DIPs have good range of motion with no synovitis.  Complete fist formation bilaterally.  Hip joints have good range of motion with no groin pain.  Knee joints have good range of motion with no warmth or effusion.  Ankle joints have good range of motion with no tenderness or joint swelling.  No tenderness over MTP  joints.  CDAI Exam: CDAI Score: -- Patient Global: 1 mm; Provider Global: 1 mm Swollen: --; Tender: -- Joint Exam 08/07/2022   No joint exam has been documented for this visit   There is currently no information documented on the homunculus. Go to the Rheumatology activity and complete the homunculus joint exam.  Investigation: No additional findings.  Imaging: No results found.  Recent Labs: Lab Results  Component Value Date   WBC 3.1 (L) 05/08/2022   HGB 11.7 05/08/2022   PLT 234 05/08/2022   NA 142 05/08/2022   K 4.1 05/08/2022   CL 105 05/08/2022   CO2 30 05/08/2022   GLUCOSE 69 05/08/2022  BUN 13 05/08/2022   CREATININE 0.67 05/08/2022   BILITOT 0.6 05/08/2022   ALKPHOS 107 10/02/2014   AST 19 05/08/2022   ALT 9 05/08/2022   PROT 7.0 05/08/2022   ALBUMIN 4.2 10/02/2014   CALCIUM 9.6 05/08/2022   GFRAA >60 10/02/2014   QFTBGOLDPLUS NEGATIVE 04/26/2021    Speciality Comments: PLQ eye exam: 05/11/2022 WNL. [facility]. Follow up in 1 year.  Procedures:  No procedures performed Allergies: Oxycodone-acetaminophen, Sulfa antibiotics, Tetracyclines & related, Hydrocodone, Oxycodone-acetaminophen, Shellfish allergy, and Tetracycline    Assessment / Plan:     Visit Diagnoses: Rheumatoid arthritis with rheumatoid factor of multiple sites without organ or systems involvement (HCC) - Positive RF, positive anti-CCP, elevated sed rate and synovitis: She has no synovitis on examination today.  She experiences intermittent stiffness in both hands and her right wrist joint but has no active inflammation or joint tenderness at this time.  She has clinically been doing well taking Plaquenil 200 mg 1 tablet by mouth daily.  She is tolerating Plaquenil without any side effects and has not missed any doses recently.  No medication changes will be made at this time.  She was advised to notify us if she develops signs or symptoms of a flare.  She is currently on  medical leave from work and won't be returning to work until [employer] at the earliest.  Patient plans on focusing on good sleep hygiene, regular exercise, and stress management while out of work. She will follow up in the office 5 months or sooner if needed.   High risk medication use - Plaquenil 200 mg 1 tablet by mouth once daily.  PLQ eye exam 05/11/2022  [facility] per patient.  CBC and CMP drawn on 05/08/2022.  CBC and CMP updated today.  Planning to continue to update lab work every 3 months given history of neutropenia.   Standing orders for CBC and CMP remain in place. - Plan: CBC with Differential/Platelet, COMPLETE METABOLIC PANEL WITH GFR, CBC with Differential/Platelet, COMPLETE METABOLIC PANEL WITH GFR  Chronic left shoulder pain - Unremarkable XR.  Good range of motion with no discomfort at this time.  Pain in both feet: She has good range of motion of both ankle joints with no tenderness or joint swelling.  She is wearing proper fitting shoes.  Positive ANA (antinuclear antibody) - Low titer positive ANA, ENA negative.  ENA was negative and complements were normal.  No clinical features of systemic lupus.  Elevated CK - CK was 206 on 04/26/2021. No muscle weakness.    Other medical conditions are listed as follows:   Primary hypertension: Blood pressure was 152/97 today in the office.  BP was rechecked prior to leaving.   History of gastroesophageal reflux (GERD)  H/O hypercholesterolemia  Kidney stone  Other insomnia  Liver mass  History of sleep apnea  History of anxiety  Family history of rheumatoid arthritis in mother  Orders: Orders Placed This Encounter  Procedures   CBC with Differential/Platelet   COMPLETE METABOLIC PANEL WITH GFR   CBC with Differential/Platelet   COMPLETE METABOLIC PANEL WITH GFR   No orders of the defined types were placed in this encounter.   Follow-Up Instructions: Return in about 5 months (around 01/07/2023) for  Rheumatoid arthritis.   Gearldine Bienenstock, PA-C  Note - This record has been created using Dragon software.  Chart creation errors have been sought, but may not always  have been located. Such creation errors do not  reflect on  the standard of medical care.

## 2022-08-07 ENCOUNTER — Encounter: Payer: Self-pay | Admitting: Physician Assistant

## 2022-08-07 ENCOUNTER — Ambulatory Visit: Payer: BC Managed Care – PPO | Attending: Physician Assistant | Admitting: Physician Assistant

## 2022-08-07 VITALS — BP 148/91 | HR 71 | Resp 13 | Ht 59.0 in | Wt 129.6 lb

## 2022-08-07 DIAGNOSIS — Z8659 Personal history of other mental and behavioral disorders: Secondary | ICD-10-CM | POA: Diagnosis present

## 2022-08-07 DIAGNOSIS — M79672 Pain in left foot: Secondary | ICD-10-CM | POA: Insufficient documentation

## 2022-08-07 DIAGNOSIS — R768 Other specified abnormal immunological findings in serum: Secondary | ICD-10-CM | POA: Diagnosis present

## 2022-08-07 DIAGNOSIS — Z8669 Personal history of other diseases of the nervous system and sense organs: Secondary | ICD-10-CM

## 2022-08-07 DIAGNOSIS — M0579 Rheumatoid arthritis with rheumatoid factor of multiple sites without organ or systems involvement: Secondary | ICD-10-CM

## 2022-08-07 DIAGNOSIS — Z8639 Personal history of other endocrine, nutritional and metabolic disease: Secondary | ICD-10-CM

## 2022-08-07 DIAGNOSIS — G8929 Other chronic pain: Secondary | ICD-10-CM | POA: Diagnosis present

## 2022-08-07 DIAGNOSIS — R160 Hepatomegaly, not elsewhere classified: Secondary | ICD-10-CM | POA: Diagnosis present

## 2022-08-07 DIAGNOSIS — M79671 Pain in right foot: Secondary | ICD-10-CM

## 2022-08-07 DIAGNOSIS — G4709 Other insomnia: Secondary | ICD-10-CM

## 2022-08-07 DIAGNOSIS — Z8261 Family history of arthritis: Secondary | ICD-10-CM | POA: Diagnosis present

## 2022-08-07 DIAGNOSIS — M25512 Pain in left shoulder: Secondary | ICD-10-CM | POA: Diagnosis present

## 2022-08-07 DIAGNOSIS — Z8719 Personal history of other diseases of the digestive system: Secondary | ICD-10-CM | POA: Diagnosis present

## 2022-08-07 DIAGNOSIS — N200 Calculus of kidney: Secondary | ICD-10-CM | POA: Diagnosis present

## 2022-08-07 DIAGNOSIS — I10 Essential (primary) hypertension: Secondary | ICD-10-CM | POA: Diagnosis present

## 2022-08-07 DIAGNOSIS — Z79899 Other long term (current) drug therapy: Secondary | ICD-10-CM | POA: Diagnosis present

## 2022-08-07 DIAGNOSIS — R748 Abnormal levels of other serum enzymes: Secondary | ICD-10-CM | POA: Diagnosis present

## 2022-08-07 NOTE — Progress Notes (Signed)
WBC count remains low-3.3 but has improved. Rest of CBC WNL.  We will continue to monitor lab work every 3 months

## 2022-08-07 NOTE — Patient Instructions (Signed)
Standing Labs We placed an order today for your standing lab work.   Please have your standing labs drawn in September    Please have your labs drawn 2 weeks prior to your appointment so that the provider can discuss your lab results at your appointment, if possible.  Please note that you may see your imaging and lab results in MyChart before we have reviewed them. We will contact you once all results are reviewed. Please allow our office up to 72 hours to thoroughly review all of the results before contacting the office for clarification of your results.  WALK-IN LAB HOURS  Monday through Thursday from 8:00 am -12:30 pm and 1:00 pm-5:00 pm and Friday from 8:00 am-12:00 pm.  Patients with office visits requiring labs will be seen before walk-in labs.  You may encounter longer than normal wait times. Please allow additional time. Wait times may be shorter on  Monday and Thursday afternoons.  We do not book appointments for walk-in labs. We appreciate your patience and understanding with our staff.   Labs are drawn by [facility]. Please bring your co-pay at the time of your lab draw.  You may receive a bill from [facility] for your lab work.  Please note if you are on Hydroxychloroquine and and an order has been placed for a Hydroxychloroquine level,  you will need to have it drawn 4 hours or more after your last dose.  If you wish to have your labs drawn at another location, please call the office 24 hours in advance so we can fax the orders.  The office is located at [address], [address], [place], [place] [address]   If you have any questions regarding directions or hours of operation,  please call [phone].   As a reminder, please drink plenty of water prior to coming for your lab work. Thanks!  

## 2022-08-08 LAB — COMPLETE METABOLIC PANEL WITH GFR
AG Ratio: 1.5 (calc) (ref 1.0–2.5)
ALT: 14 U/L (ref 6–29)
AST: 22 U/L (ref 10–35)
Albumin: 4.1 g/dL (ref 3.6–5.1)
Alkaline phosphatase (APISO): 111 U/L (ref 37–153)
BUN: 13 mg/dL (ref 7–25)
CO2: 29 mmol/L (ref 20–32)
Calcium: 9.8 mg/dL (ref 8.6–10.4)
Chloride: 104 mmol/L (ref 98–110)
Creat: 0.62 mg/dL (ref 0.50–1.03)
Globulin: 2.8 g/dL (calc) (ref 1.9–3.7)
Glucose, Bld: 68 mg/dL (ref 65–99)
Potassium: 4 mmol/L (ref 3.5–5.3)
Sodium: 141 mmol/L (ref 135–146)
Total Bilirubin: 0.6 mg/dL (ref 0.2–1.2)
Total Protein: 6.9 g/dL (ref 6.1–8.1)
eGFR: 106 mL/min/{1.73_m2} (ref >=60)

## 2022-08-08 LAB — CBC WITH DIFFERENTIAL/PLATELET
Absolute Monocytes: 333 cells/uL (ref 200–950)
Basophils Absolute: 10 cells/uL (ref 0–200)
Basophils Relative: 0.3 %
Eosinophils Absolute: 20 cells/uL (ref 15–500)
Eosinophils Relative: 0.6 %
HCT: 37.3 % (ref 35.0–45.0)
Hemoglobin: 12.3 g/dL (ref 11.7–15.5)
Lymphs Abs: 1327 cells/uL (ref 850–3900)
MCH: 30.1 pg (ref 27.0–33.0)
MCHC: 33 g/dL (ref 32.0–36.0)
MCV: 91.4 fL (ref 80.0–100.0)
MPV: 11.8 fL (ref 7.5–12.5)
Monocytes Relative: 10.1 %
Neutro Abs: 1610 cells/uL (ref 1500–7800)
Neutrophils Relative %: 48.8 %
Platelets: 247 10*3/uL (ref 140–400)
RBC: 4.08 10*6/uL (ref 3.80–5.10)
RDW: 11.4 % (ref 11.0–15.0)
Total Lymphocyte: 40.2 %
WBC: 3.3 10*3/uL — ABNORMAL LOW (ref 3.8–10.8)

## 2022-08-08 NOTE — Progress Notes (Signed)
CMP WNL

## 2022-10-05 ENCOUNTER — Other Ambulatory Visit: Payer: Self-pay | Admitting: Family Medicine

## 2022-10-05 DIAGNOSIS — Z1231 Encounter for screening mammogram for malignant neoplasm of breast: Secondary | ICD-10-CM

## 2022-10-09 ENCOUNTER — Ambulatory Visit
Admission: RE | Admit: 2022-10-09 | Discharge: 2022-10-09 | Disposition: A | Discharge status: Home / Self Care | Payer: BC Managed Care – PPO | Source: Ambulatory Visit | Attending: Family Medicine | Admitting: Family Medicine

## 2022-10-09 DIAGNOSIS — Z1231 Encounter for screening mammogram for malignant neoplasm of breast: Secondary | ICD-10-CM

## 2022-11-07 ENCOUNTER — Other Ambulatory Visit: Payer: Self-pay | Admitting: *Deleted

## 2022-11-07 DIAGNOSIS — Z79899 Other long term (current) drug therapy: Secondary | ICD-10-CM

## 2022-11-08 ENCOUNTER — Other Ambulatory Visit: Payer: Self-pay | Admitting: *Deleted

## 2022-11-08 DIAGNOSIS — R748 Abnormal levels of other serum enzymes: Secondary | ICD-10-CM

## 2022-11-08 DIAGNOSIS — R768 Other specified abnormal immunological findings in serum: Secondary | ICD-10-CM

## 2022-11-08 DIAGNOSIS — M0579 Rheumatoid arthritis with rheumatoid factor of multiple sites without organ or systems involvement: Secondary | ICD-10-CM

## 2022-11-08 DIAGNOSIS — Z79899 Other long term (current) drug therapy: Secondary | ICD-10-CM

## 2022-11-08 LAB — CBC WITH DIFFERENTIAL/PLATELET
Absolute Monocytes: 310 {cells}/uL (ref 200–950)
Basophils Absolute: 9 {cells}/uL (ref 0–200)
Basophils Relative: 0.3 %
Eosinophils Absolute: 9 {cells}/uL — ABNORMAL LOW (ref 15–500)
Eosinophils Relative: 0.3 %
HCT: 35 % (ref 35.0–45.0)
Hemoglobin: 11.4 g/dL — ABNORMAL LOW (ref 11.7–15.5)
Lymphs Abs: 1105 {cells}/uL (ref 850–3900)
MCH: 29.9 pg (ref 27.0–33.0)
MCHC: 32.6 g/dL (ref 32.0–36.0)
MCV: 91.9 fL (ref 80.0–100.0)
MPV: 12.1 fL (ref 7.5–12.5)
Monocytes Relative: 10.7 %
Neutro Abs: 1467 {cells}/uL — ABNORMAL LOW (ref 1500–7800)
Neutrophils Relative %: 50.6 %
Platelets: 219 10*3/uL (ref 140–400)
RBC: 3.81 10*6/uL (ref 3.80–5.10)
RDW: 11.2 % (ref 11.0–15.0)
Total Lymphocyte: 38.1 %
WBC: 2.9 10*3/uL — ABNORMAL LOW (ref 3.8–10.8)

## 2022-11-08 LAB — COMPLETE METABOLIC PANEL WITH GFR
AG Ratio: 1.7 (calc) (ref 1.0–2.5)
ALT: 8 U/L (ref 6–29)
AST: 18 U/L (ref 10–35)
Albumin: 4.1 g/dL (ref 3.6–5.1)
Alkaline phosphatase (APISO): 126 U/L (ref 37–153)
BUN: 17 mg/dL (ref 7–25)
CO2: 29 mmol/L (ref 20–32)
Calcium: 9.4 mg/dL (ref 8.6–10.4)
Chloride: 103 mmol/L (ref 98–110)
Creat: 0.64 mg/dL (ref 0.50–1.03)
Globulin: 2.4 g/dL (ref 1.9–3.7)
Glucose, Bld: 85 mg/dL (ref 65–99)
Potassium: 3.9 mmol/L (ref 3.5–5.3)
Sodium: 140 mmol/L (ref 135–146)
Total Bilirubin: 0.7 mg/dL (ref 0.2–1.2)
Total Protein: 6.5 g/dL (ref 6.1–8.1)
eGFR: 105 mL/min/{1.73_m2} (ref >=60)

## 2022-11-08 NOTE — Progress Notes (Signed)
CMP WNL.  WBC count is low-2.9.  absolute neutrophils and eosinophils are low.  Hemoglobin is borderline low-11.4. Recommend rechecking CBC with diff in 1 month.

## 2022-11-21 LAB — LAB REPORT - SCANNED
EGFR: 101
HM HIV Screening: NEGATIVE

## 2022-11-27 NOTE — Progress Notes (Deleted)
Office Visit Note  Patient: [name]             Date of Birth: [birth year]           MRN: [id number]             PCP: Alease Medina, MD Referring: Evelene Croon, MD Visit Date: 12/07/2022 Occupation: @GUAROCC @  Subjective:  No chief complaint on file.   History of Present Illness: [name] is a 52 y.o. female ***     Activities of Daily Living:  Patient reports morning stiffness for *** {minute/hour:19697}.   Patient {ACTIONS;DENIES/REPORTS:21021675::"Denies"} nocturnal pain.  Difficulty dressing/grooming: {ACTIONS;DENIES/REPORTS:21021675::"Denies"} Difficulty climbing stairs: {ACTIONS;DENIES/REPORTS:21021675::"Denies"} Difficulty getting out of chair: {ACTIONS;DENIES/REPORTS:21021675::"Denies"} Difficulty using hands for taps, buttons, cutlery, and/or writing: {ACTIONS;DENIES/REPORTS:21021675::"Denies"}  No Rheumatology ROS completed.   PMFS History:  Patient Active Problem List   Diagnosis Date Noted   Polyarthralgia 04/26/2021   Kidney stone 05/06/2012   Dense breasts 02/05/2012   Anemia    Anxiety    Arthritis    Hypertension    Liver mass    Ovarian cyst    Insomnia    H/O hypercholesterolemia    GERD (gastroesophageal reflux disease) 11/29/2010   Fibroadenoma of breast 11/11/2009    Past Medical History:  Diagnosis Date   Anemia    Anxiety    Arthritis    lower back   Depression    Endocervical polyp    h/o   Fibroadenoma of breast    left breast   Fibroids    h/o   GERD (gastroesophageal reflux disease)    on nexium-instructed to take dos   H/O hypercholesterolemia    History of right oophorectomy    Hypertension    controlled on hctz-b/p 142/93 at pat visit-will do ekg at pat visit   Insomnia    Liver mass 12/2007   benign- per pt " no longer there"   Ovarian cyst    h/o    Family History  Problem Relation Age of Onset   Rheum arthritis Mother    Heart attack Father    Pancreatic cancer Maternal Grandmother     Breast cancer Neg Hx    Past Surgical History:  Procedure Laterality Date   ABDOMINAL HYSTERECTOMY  11/29/2010   BILATERAL SALPINGECTOMY Bilateral 06/12/2012   Procedure: BILATERAL SALPINGECTOMY;  Surgeon: Hal Morales, MD;  Location: [facility];  Service: Gynecology;  Laterality: Bilateral;   COLON SURGERY     ECTOPIC PREGNANCY SURGERY  1992   LAPAROSCOPIC ASSISTED VAGINAL HYSTERECTOMY  11/29/2010   Procedure: LAPAROSCOPIC ASSISTED VAGINAL HYSTERECTOMY;  Surgeon: Hal Morales, MD;  Location: [facility];  Service: Gynecology;  Laterality: N/A;   LAPAROSCOPY N/A 06/12/2012   Procedure: LAPAROSCOPY OPERATIVE;  Surgeon: Hal Morales, MD;  Location: [facility];  Service: Gynecology;  Laterality: N/A;   MYOMECTOMY  2006   Social History   Social History Narrative   Not on file   Immunization History  Administered Date(s) Administered   PFIZER(Purple Top)SARS-COV-2 Vaccination 01/02/2020, 01/23/2020     Objective: Vital Signs: LMP 11/03/2010    Physical Exam   Musculoskeletal Exam: ***  CDAI Exam: CDAI Score: -- Patient Global: --; Provider Global: -- Swollen: --; Tender: -- Joint Exam 12/07/2022   No joint exam has been documented for this visit   There is currently no information documented on the homunculus. Go to the Rheumatology activity and complete the homunculus joint exam.  Investigation: No additional findings.  Imaging: No  results found.  Recent Labs: Lab Results  Component Value Date   WBC 2.9 (L) 11/07/2022   HGB 11.4 (L) 11/07/2022   PLT 219 11/07/2022   NA 140 11/07/2022   K 3.9 11/07/2022   CL 103 11/07/2022   CO2 29 11/07/2022   GLUCOSE 85 11/07/2022   BUN 17 11/07/2022   CREATININE 0.64 11/07/2022   BILITOT 0.7 11/07/2022   ALKPHOS 107 10/02/2014   AST 18 11/07/2022   ALT 8 11/07/2022   PROT 6.5 11/07/2022   ALBUMIN 4.2 10/02/2014   CALCIUM 9.4 11/07/2022   GFRAA >60 10/02/2014   QFTBGOLDPLUS NEGATIVE 04/26/2021    Speciality  Comments: PLQ eye exam: 05/11/2022 WNL. [facility]. Follow up in 1 year.  Procedures:  No procedures performed Allergies: Oxycodone-acetaminophen, Sulfa antibiotics, Tetracyclines & related, Hydrocodone, Oxycodone-acetaminophen, Shellfish allergy, and Tetracycline   Assessment / Plan:     Visit Diagnoses: Rheumatoid arthritis with rheumatoid factor of multiple sites without organ or systems involvement (HCC)  High risk medication use  Positive ANA (antinuclear antibody)  Elevated CK  Chronic left shoulder pain  Pain in both feet  Primary hypertension  History of gastroesophageal reflux (GERD)  H/O hypercholesterolemia  Kidney stone  Other insomnia  Liver mass  History of sleep apnea  History of anxiety  Family history of rheumatoid arthritis in mother  Orders: No orders of the defined types were placed in this encounter.  No orders of the defined types were placed in this encounter.   Face-to-face time spent with patient was *** minutes. Greater than 50% of time was spent in counseling and coordination of care.  Follow-Up Instructions: No follow-ups on file.   Gearldine Bienenstock, PA-C  Note - This record has been created using Dragon software.  Chart creation errors have been sought, but may not always  have been located. Such creation errors do not reflect on  the standard of medical care.

## 2022-11-30 ENCOUNTER — Telehealth: Payer: Self-pay | Admitting: *Deleted

## 2022-11-30 NOTE — Telephone Encounter (Signed)
Labs received from: Dr. Evette Doffing   Drawn on: 11/22/2022  Reviewed by: Sherron Ales, PA-C  Labs drawn: Lipid Panel, CMP, CBC, HIV, Vitamin D  Results: Cholesterol, Total 301   LDL- Cholesterol 198   Non-HDL Cholesterol 221   WBC 2.3   Absolute Neutrophils 936  Patient is on PLQ 200 mg po daily.  Per Ladona Ridgel, WBC chronically low but stable.

## 2022-12-05 ENCOUNTER — Other Ambulatory Visit: Payer: Self-pay | Admitting: Nurse Practitioner

## 2022-12-05 DIAGNOSIS — E782 Mixed hyperlipidemia: Secondary | ICD-10-CM

## 2022-12-05 DIAGNOSIS — I10 Essential (primary) hypertension: Secondary | ICD-10-CM

## 2022-12-06 NOTE — Progress Notes (Unsigned)
Office Visit Note  Patient: [name]             Date of Birth: 28-Apr-1970           MRN: [id number]             PCP: Alease Medina, MD Referring: Evelene Croon, MD Visit Date: 12/20/2022 Occupation: @GUAROCC @  Subjective:  Medication monitoring   History of Present Illness: [name] is a 52 y.o. female with history of seropositive rheumatoid arthritis.  Patient remains on Plaquenil 200 mg 1 tablet by mouth once daily.  She is tolerating Plaquenil without any side effects and has not missed any doses recently.  She denies any signs or symptoms of a rheumatoid arthritis flare.  She has started drinking turmeric and ginger tea as well as has increased her fruit and vegetable intake.  She has been trying to drink more water.  Patient feels that by making dietary changes her joint pain has been better tolerated.  She denies any joint swelling at this time.  She experiences occasional stiffness in her hands but has otherwise been asymptomatic. She denies any recent or recurrent infections.  She denies any new medical conditions.  Activities of Daily Living:  Patient reports morning stiffness for 0 minute.   Patient Denies nocturnal pain.  Difficulty dressing/grooming: Denies Difficulty climbing stairs: Denies Difficulty getting out of chair: Denies Difficulty using hands for taps, buttons, cutlery, and/or writing: Reports  Review of Systems  Constitutional:  Positive for fatigue.  HENT:  Negative for mouth sores and mouth dryness.   Eyes:  Negative for dryness.  Respiratory:  Negative for shortness of breath.   Cardiovascular:  Negative for chest pain and palpitations.  Gastrointestinal:  Negative for blood in stool, constipation and diarrhea.  Endocrine: Positive for increased urination.  Genitourinary:  Negative for involuntary urination.  Musculoskeletal:  Negative for joint pain, gait problem, joint pain, joint swelling, myalgias, muscle weakness, morning  stiffness, muscle tenderness and myalgias.  Skin:  Negative for color change, rash, hair loss and sensitivity to sunlight.  Allergic/Immunologic: Negative for susceptible to infections.  Neurological:  Negative for dizziness and headaches.  Hematological:  Negative for swollen glands.  Psychiatric/Behavioral:  Negative for depressed mood and sleep disturbance. The patient is not nervous/anxious.     PMFS History:  Patient Active Problem List   Diagnosis Date Noted   Polyarthralgia 04/26/2021   Kidney stone 05/06/2012   Dense breasts 02/05/2012   Anemia    Anxiety    Arthritis    Hypertension    Liver mass    Ovarian cyst    Insomnia    H/O hypercholesterolemia    GERD (gastroesophageal reflux disease) 11/29/2010   Fibroadenoma of breast 11/11/2009    Past Medical History:  Diagnosis Date   Anemia    Anxiety    Arthritis    lower back   Depression    Endocervical polyp    h/o   Fibroadenoma of breast    left breast   Fibroids    h/o   GERD (gastroesophageal reflux disease)    on nexium-instructed to take dos   H/O hypercholesterolemia    History of right oophorectomy    Hypertension    controlled on hctz-b/p 142/93 at pat visit-will do ekg at pat visit   Insomnia    Liver mass 12/2007   benign- per pt " no longer there"   Ovarian cyst    h/o    Family  History  Problem Relation Age of Onset   Rheum arthritis Mother    Heart attack Father    Pancreatic cancer Maternal Grandmother    Breast cancer Neg Hx    Past Surgical History:  Procedure Laterality Date   ABDOMINAL HYSTERECTOMY  11/29/2010   BILATERAL SALPINGECTOMY Bilateral 06/12/2012   Procedure: BILATERAL SALPINGECTOMY;  Surgeon: Hal Morales, MD;  Location: [facility];  Service: Gynecology;  Laterality: Bilateral;   COLON SURGERY     ECTOPIC PREGNANCY SURGERY  1992   LAPAROSCOPIC ASSISTED VAGINAL HYSTERECTOMY  11/29/2010   Procedure: LAPAROSCOPIC ASSISTED VAGINAL HYSTERECTOMY;  Surgeon: Hal Morales, MD;  Location: [facility];  Service: Gynecology;  Laterality: N/A;   LAPAROSCOPY N/A 06/12/2012   Procedure: LAPAROSCOPY OPERATIVE;  Surgeon: Hal Morales, MD;  Location: [facility];  Service: Gynecology;  Laterality: N/A;   MYOMECTOMY  2006   Social History   Social History Narrative   Not on file   Immunization History  Administered Date(s) Administered   PFIZER(Purple Top)SARS-COV-2 Vaccination 01/02/2020, 01/23/2020     Objective: Vital Signs: BP 131/79 (BP Location: Left Arm, Patient Position: Sitting, Cuff Size: Normal)   Pulse 87   Resp 14   Ht 5' 0.5" (1.537 m)   Wt 128 lb (58.1 kg)   LMP 11/03/2010   BMI 24.59 kg/m    Physical Exam Vitals and nursing note reviewed.  Constitutional:      Appearance: She is well-developed.  HENT:     Head: Normocephalic and atraumatic.  Eyes:     Conjunctiva/sclera: Conjunctivae normal.  Cardiovascular:     Rate and Rhythm: Normal rate and regular rhythm.     Heart sounds: Normal heart sounds.  Pulmonary:     Effort: Pulmonary effort is normal.     Breath sounds: Normal breath sounds.  Abdominal:     General: Bowel sounds are normal.     Palpations: Abdomen is soft.  Musculoskeletal:     Cervical back: Normal range of motion.  Lymphadenopathy:     Cervical: No cervical adenopathy.  Skin:    General: Skin is warm and dry.     Capillary Refill: Capillary refill takes less than 2 seconds.  Neurological:     Mental Status: She is alert and oriented to person, place, and time.  Psychiatric:        Behavior: Behavior normal.      Musculoskeletal Exam: C-spine, thoracic spine, lumbar spine good range of motion.  Shoulder joints, elbow joints, wrist joints, MCPs, PIPs, DIPs have good range of motion with no synovitis.  Complete fist formation bilaterally.  Hip joints have good range of motion with no groin pain.  Knee joints have good range of motion no warmth or effusion.  Ankle joints have good range of motion with no  tenderness or joint swelling.  CDAI Exam: CDAI Score: -- Patient Global: 0 / 100; Provider Global: 0 / 100 Swollen: --; Tender: -- Joint Exam 12/20/2022   No joint exam has been documented for this visit   There is currently no information documented on the homunculus. Go to the Rheumatology activity and complete the homunculus joint exam.  Investigation: No additional findings.  Imaging: No results found.  Recent Labs: Lab Results  Component Value Date   WBC 2.9 (L) 11/07/2022   HGB 11.4 (L) 11/07/2022   PLT 219 11/07/2022   NA 140 11/07/2022   K 3.9 11/07/2022   CL 103 11/07/2022   CO2 29 11/07/2022  GLUCOSE 85 11/07/2022   BUN 17 11/07/2022   CREATININE 0.64 11/07/2022   BILITOT 0.7 11/07/2022   ALKPHOS 107 10/02/2014   AST 18 11/07/2022   ALT 8 11/07/2022   PROT 6.5 11/07/2022   ALBUMIN 4.2 10/02/2014   CALCIUM 9.4 11/07/2022   GFRAA >60 10/02/2014   QFTBGOLDPLUS NEGATIVE 04/26/2021    Speciality Comments: PLQ eye exam: 05/11/2022 WNL. [facility]. Follow up in 1 year.  Procedures:  No procedures performed Allergies: Oxycodone-acetaminophen, Sulfa antibiotics, Tetracyclines & related, Hydrocodone, Oxycodone-acetaminophen, Shellfish allergy, and Tetracycline   Assessment / Plan:     Visit Diagnoses: Rheumatoid arthritis with rheumatoid factor of multiple sites without organ or systems involvement (HCC) - Positive RF, positive anti-CCP, elevated sed rate and synovitis: She has no joint tenderness or synovitis on examination today.  She has not had any signs or symptoms of a rheumatoid arthritis flare.  She has made dietary changes including following a plant-based diet and drinking tumeric and ginger tea for the antiinflammatory properties.  She has clinically been doing well taking Plaquenil 200 mg 1 tablet by mouth daily.  She is tolerating Plaquenil without any side effects and has not missed any doses recently.  No medication changes will be  made at this time.  A refill of Plaquenil sent to the pharmacy today.  She is vies notify us if she develops signs or symptoms of a flare.  She will follow-up in the office in 5 months or sooner if needed.  Association of heart disease with rheumatoid arthritis was discussed. Need to monitor blood pressure, cholesterol, and to exercise 30-60 minutes on daily basis was discussed.   High risk medication use - Plaquenil 200 mg 1 tablet by mouth once daily.  CBC and CMP updated on 11/07/22.  White blood cell count was 2.9 --absolute neutrophils and absolute eosinophils are low- CBC with diff updated today.  PLQ eye exam: 05/11/2022 WNL. [facility]. Follow up in 1 year.  - Plan: CBC with Differential/Platelet  Other neutropenia (HCC) -longstanding history of neutropenia.  White blood cell count was 2.9 on 11/07/2022.  CBC with diff updated today.  Plan: CBC with Differential/Platelet  Positive ANA (antinuclear antibody) - Low titer positive ANA. ENA was negative and complements were normal. History of neutropenia. No clinical features of systemic lupus.  Elevated CK - CK was 206 on 04/26/2021. No muscle weakness.  Chronic left shoulder pain: Not currently symptomatic.   Pain in both feet: She is not experiencing any increased discomfort in her feet at this time.    Other medical conditions are listed as follows:   Primary hypertension: BP was 131/79 today in the office.   History of gastroesophageal reflux (GERD)  H/O hypercholesterolemia  Kidney stone  Other insomnia  Liver mass  History of sleep apnea  History of anxiety  Family history of rheumatoid arthritis in mother    Orders: Orders Placed This Encounter  Procedures   CBC with Differential/Platelet   Meds ordered this encounter  Medications   hydroxychloroquine (PLAQUENIL) 200 MG tablet    Sig: Take 1 tablet (200 mg total) by mouth daily.    Dispense:  90 tablet    Refill:  0      Follow-Up  Instructions: Return in about 5 months (around 05/20/2023) for Rheumatoid arthritis.   Gearldine Bienenstock, PA-C  Note - This record has been created using Dragon software.  Chart creation errors have been sought, but may not always  have been located. Such creation errors do not reflect on  the standard of medical care.

## 2022-12-07 ENCOUNTER — Ambulatory Visit: Payer: BC Managed Care – PPO | Admitting: Physician Assistant

## 2022-12-07 DIAGNOSIS — M79671 Pain in right foot: Secondary | ICD-10-CM

## 2022-12-07 DIAGNOSIS — Z8261 Family history of arthritis: Secondary | ICD-10-CM

## 2022-12-07 DIAGNOSIS — Z79899 Other long term (current) drug therapy: Secondary | ICD-10-CM

## 2022-12-07 DIAGNOSIS — R748 Abnormal levels of other serum enzymes: Secondary | ICD-10-CM

## 2022-12-07 DIAGNOSIS — G8929 Other chronic pain: Secondary | ICD-10-CM

## 2022-12-07 DIAGNOSIS — Z8669 Personal history of other diseases of the nervous system and sense organs: Secondary | ICD-10-CM

## 2022-12-07 DIAGNOSIS — R768 Other specified abnormal immunological findings in serum: Secondary | ICD-10-CM

## 2022-12-07 DIAGNOSIS — Z8719 Personal history of other diseases of the digestive system: Secondary | ICD-10-CM

## 2022-12-07 DIAGNOSIS — Z8659 Personal history of other mental and behavioral disorders: Secondary | ICD-10-CM

## 2022-12-07 DIAGNOSIS — N200 Calculus of kidney: Secondary | ICD-10-CM

## 2022-12-07 DIAGNOSIS — R160 Hepatomegaly, not elsewhere classified: Secondary | ICD-10-CM

## 2022-12-07 DIAGNOSIS — I10 Essential (primary) hypertension: Secondary | ICD-10-CM

## 2022-12-07 DIAGNOSIS — M0579 Rheumatoid arthritis with rheumatoid factor of multiple sites without organ or systems involvement: Secondary | ICD-10-CM

## 2022-12-07 DIAGNOSIS — Z8639 Personal history of other endocrine, nutritional and metabolic disease: Secondary | ICD-10-CM

## 2022-12-07 DIAGNOSIS — G4709 Other insomnia: Secondary | ICD-10-CM

## 2022-12-20 ENCOUNTER — Encounter: Payer: Self-pay | Admitting: Physician Assistant

## 2022-12-20 ENCOUNTER — Ambulatory Visit: Payer: BC Managed Care – PPO | Attending: Physician Assistant | Admitting: Physician Assistant

## 2022-12-20 ENCOUNTER — Other Ambulatory Visit: Payer: BC Managed Care – PPO

## 2022-12-20 VITALS — BP 131/79 | HR 87 | Resp 14 | Ht 60.5 in | Wt 128.0 lb

## 2022-12-20 DIAGNOSIS — Z8639 Personal history of other endocrine, nutritional and metabolic disease: Secondary | ICD-10-CM | POA: Insufficient documentation

## 2022-12-20 DIAGNOSIS — M79672 Pain in left foot: Secondary | ICD-10-CM | POA: Diagnosis present

## 2022-12-20 DIAGNOSIS — Z79899 Other long term (current) drug therapy: Secondary | ICD-10-CM | POA: Diagnosis present

## 2022-12-20 DIAGNOSIS — M25512 Pain in left shoulder: Secondary | ICD-10-CM | POA: Diagnosis present

## 2022-12-20 DIAGNOSIS — Z8719 Personal history of other diseases of the digestive system: Secondary | ICD-10-CM | POA: Diagnosis present

## 2022-12-20 DIAGNOSIS — Z8659 Personal history of other mental and behavioral disorders: Secondary | ICD-10-CM | POA: Diagnosis present

## 2022-12-20 DIAGNOSIS — M79671 Pain in right foot: Secondary | ICD-10-CM | POA: Diagnosis present

## 2022-12-20 DIAGNOSIS — R748 Abnormal levels of other serum enzymes: Secondary | ICD-10-CM | POA: Insufficient documentation

## 2022-12-20 DIAGNOSIS — D708 Other neutropenia: Secondary | ICD-10-CM | POA: Diagnosis present

## 2022-12-20 DIAGNOSIS — Z8261 Family history of arthritis: Secondary | ICD-10-CM | POA: Insufficient documentation

## 2022-12-20 DIAGNOSIS — N200 Calculus of kidney: Secondary | ICD-10-CM | POA: Insufficient documentation

## 2022-12-20 DIAGNOSIS — G8929 Other chronic pain: Secondary | ICD-10-CM | POA: Diagnosis present

## 2022-12-20 DIAGNOSIS — R160 Hepatomegaly, not elsewhere classified: Secondary | ICD-10-CM | POA: Insufficient documentation

## 2022-12-20 DIAGNOSIS — G4709 Other insomnia: Secondary | ICD-10-CM | POA: Insufficient documentation

## 2022-12-20 DIAGNOSIS — M0579 Rheumatoid arthritis with rheumatoid factor of multiple sites without organ or systems involvement: Secondary | ICD-10-CM | POA: Diagnosis present

## 2022-12-20 DIAGNOSIS — I10 Essential (primary) hypertension: Secondary | ICD-10-CM | POA: Insufficient documentation

## 2022-12-20 DIAGNOSIS — Z8669 Personal history of other diseases of the nervous system and sense organs: Secondary | ICD-10-CM | POA: Diagnosis present

## 2022-12-20 DIAGNOSIS — R768 Other specified abnormal immunological findings in serum: Secondary | ICD-10-CM | POA: Diagnosis present

## 2022-12-20 LAB — CBC WITH DIFFERENTIAL/PLATELET
Absolute Lymphocytes: 1571 {cells}/uL (ref 850–3900)
Absolute Monocytes: 320 {cells}/uL (ref 200–950)
Basophils Absolute: 10 {cells}/uL (ref 0–200)
Basophils Relative: 0.3 %
Eosinophils Absolute: 30 {cells}/uL (ref 15–500)
Eosinophils Relative: 0.9 %
HCT: 36.6 % (ref 35.0–45.0)
Hemoglobin: 11.6 g/dL — ABNORMAL LOW (ref 11.7–15.5)
MCH: 29.2 pg (ref 27.0–33.0)
MCHC: 31.7 g/dL — ABNORMAL LOW (ref 32.0–36.0)
MCV: 92.2 fL (ref 80.0–100.0)
MPV: 11.4 fL (ref 7.5–12.5)
Monocytes Relative: 9.7 %
Neutro Abs: 1370 {cells}/uL — ABNORMAL LOW (ref 1500–7800)
Neutrophils Relative %: 41.5 %
Platelets: 237 10*3/uL (ref 140–400)
RBC: 3.97 10*6/uL (ref 3.80–5.10)
RDW: 10.9 % — ABNORMAL LOW (ref 11.0–15.0)
Total Lymphocyte: 47.6 %
WBC: 3.3 10*3/uL — ABNORMAL LOW (ref 3.8–10.8)

## 2022-12-20 MED ORDER — HYDROXYCHLOROQUINE SULFATE 200 MG PO TABS: 200.0000 mg | ORAL_TABLET | Freq: Every day | ORAL | 0 refills | Status: DC

## 2022-12-21 NOTE — Progress Notes (Signed)
WBC count remains low but has improved-3.3. absolute neutrophils remain low but stable.   Hemoglobin is borderline low but has improved.

## 2023-01-08 ENCOUNTER — Ambulatory Visit: Payer: BC Managed Care – PPO | Admitting: Physician Assistant

## 2023-03-05 ENCOUNTER — Other Ambulatory Visit: Payer: BC Managed Care – PPO

## 2023-03-12 ENCOUNTER — Inpatient Hospital Stay
Admission: RE | Admit: 2023-03-12 | Discharge: 2023-03-12 | Disposition: A | Discharge status: Home / Self Care | Payer: BC Managed Care – PPO | Source: Ambulatory Visit | Attending: Nurse Practitioner | Admitting: Nurse Practitioner

## 2023-03-26 ENCOUNTER — Other Ambulatory Visit: Payer: BC Managed Care – PPO

## 2023-04-23 ENCOUNTER — Other Ambulatory Visit: Payer: BC Managed Care – PPO

## 2023-05-14 NOTE — Progress Notes (Deleted)
 Office Visit Note  Patient: [name]             Date of Birth: 1970/06/12           MRN: [id number]             PCP: Alease Medina, MD Referring: Alease Medina, MD Visit Date: 05/28/2023 Occupation: @GUAROCC @  Subjective:    History of Present Illness: [name] is a 53 y.o. female with history of osteoporosis.  Patient remains Plaquenil 200 mg 1 tablet by mouth once daily.     Activities of Daily Living:  Patient reports morning stiffness for *** {minute/hour:19697}.   Patient {ACTIONS;DENIES/REPORTS:21021675::"Denies"} nocturnal pain.  Difficulty dressing/grooming: {ACTIONS;DENIES/REPORTS:21021675::"Denies"} Difficulty climbing stairs: {ACTIONS;DENIES/REPORTS:21021675::"Denies"} Difficulty getting out of chair: {ACTIONS;DENIES/REPORTS:21021675::"Denies"} Difficulty using hands for taps, buttons, cutlery, and/or writing: {ACTIONS;DENIES/REPORTS:21021675::"Denies"}  No Rheumatology ROS completed.   PMFS History:  Patient Active Problem List   Diagnosis Date Noted   Polyarthralgia 04/26/2021   Kidney stone 05/06/2012   Dense breasts 02/05/2012   Anemia    Anxiety    Arthritis    Hypertension    Liver mass    Ovarian cyst    Insomnia    H/O hypercholesterolemia    GERD (gastroesophageal reflux disease) 11/29/2010   Fibroadenoma of breast 11/11/2009    Past Medical History:  Diagnosis Date   Anemia    Anxiety    Arthritis    lower back   Depression    Endocervical polyp    h/o   Fibroadenoma of breast    left breast   Fibroids    h/o   GERD (gastroesophageal reflux disease)    on nexium-instructed to take dos   H/O hypercholesterolemia    History of right oophorectomy    Hypertension    controlled on hctz-b/p 142/93 at pat visit-will do ekg at pat visit   Insomnia    Liver mass 12/2007   benign- per pt " no longer there"   Ovarian cyst    h/o    Family History  Problem Relation Age of Onset   Rheum arthritis Mother    Heart  attack Father    Pancreatic cancer Maternal Grandmother    Breast cancer Neg Hx    Past Surgical History:  Procedure Laterality Date   ABDOMINAL HYSTERECTOMY  11/29/2010   BILATERAL SALPINGECTOMY Bilateral 06/12/2012   Procedure: BILATERAL SALPINGECTOMY;  Surgeon: Hal Morales, MD;  Location: [facility];  Service: Gynecology;  Laterality: Bilateral;   COLON SURGERY     ECTOPIC PREGNANCY SURGERY  1992   LAPAROSCOPIC ASSISTED VAGINAL HYSTERECTOMY  11/29/2010   Procedure: LAPAROSCOPIC ASSISTED VAGINAL HYSTERECTOMY;  Surgeon: Hal Morales, MD;  Location: [facility];  Service: Gynecology;  Laterality: N/A;   LAPAROSCOPY N/A 06/12/2012   Procedure: LAPAROSCOPY OPERATIVE;  Surgeon: Hal Morales, MD;  Location: [facility];  Service: Gynecology;  Laterality: N/A;   MYOMECTOMY  2006   Social History   Social History Narrative   Not on file   Immunization History  Administered Date(s) Administered   PFIZER(Purple Top)SARS-COV-2 Vaccination 01/02/2020, 01/23/2020     Objective: Vital Signs: LMP 11/03/2010    Physical Exam Vitals and nursing note reviewed.  Constitutional:      Appearance: She is well-developed.  HENT:     Head: Normocephalic and atraumatic.  Eyes:     Conjunctiva/sclera: Conjunctivae normal.  Cardiovascular:     Rate and Rhythm: Normal rate and regular rhythm.     Heart  sounds: Normal heart sounds.  Pulmonary:     Effort: Pulmonary effort is normal.     Breath sounds: Normal breath sounds.  Abdominal:     General: Bowel sounds are normal.     Palpations: Abdomen is soft.  Musculoskeletal:     Cervical back: Normal range of motion.  Lymphadenopathy:     Cervical: No cervical adenopathy.  Skin:    General: Skin is warm and dry.     Capillary Refill: Capillary refill takes less than 2 seconds.  Neurological:     Mental Status: She is alert and oriented to person, place, and time.  Psychiatric:        Behavior: Behavior normal.      Musculoskeletal Exam:  ***  CDAI Exam: CDAI Score: -- Patient Global: --; Provider Global: -- Swollen: --; Tender: -- Joint Exam 05/28/2023   No joint exam has been documented for this visit   There is currently no information documented on the homunculus. Go to the Rheumatology activity and complete the homunculus joint exam.  Investigation: No additional findings.  Imaging: No results found.  Recent Labs: Lab Results  Component Value Date   WBC 3.3 (L) 12/20/2022   HGB 11.6 (L) 12/20/2022   PLT 237 12/20/2022   NA 140 11/07/2022   K 3.9 11/07/2022   CL 103 11/07/2022   CO2 29 11/07/2022   GLUCOSE 85 11/07/2022   BUN 17 11/07/2022   CREATININE 0.64 11/07/2022   BILITOT 0.7 11/07/2022   ALKPHOS 107 10/02/2014   AST 18 11/07/2022   ALT 8 11/07/2022   PROT 6.5 11/07/2022   ALBUMIN 4.2 10/02/2014   CALCIUM 9.4 11/07/2022   GFRAA >60 10/02/2014   QFTBGOLDPLUS NEGATIVE 04/26/2021    Speciality Comments: PLQ eye exam: 05/11/2022 WNL. [facility]. Follow up in 1 year.  Procedures:  No procedures performed Allergies: Oxycodone-acetaminophen, Sulfa antibiotics, Tetracyclines & related, Hydrocodone, Oxycodone-acetaminophen, Shellfish allergy, and Tetracycline   Assessment / Plan:     Visit Diagnoses: Rheumatoid arthritis with rheumatoid factor of multiple sites without organ or systems involvement (HCC)  High risk medication use  Positive ANA (antinuclear antibody)  Elevated CK  Chronic left shoulder pain  Pain in both feet  Primary hypertension  History of gastroesophageal reflux (GERD)  H/O hypercholesterolemia  Kidney stone  History of anxiety  Other insomnia  Liver mass  History of sleep apnea  Family history of rheumatoid arthritis in mother  Orders: No orders of the defined types were placed in this encounter.  No orders of the defined types were placed in this encounter.   Face-to-face time spent with patient was *** minutes. Greater than  50% of time was spent in counseling and coordination of care.  Follow-Up Instructions: No follow-ups on file.   Gearldine Bienenstock, PA-C  Note - This record has been created using Dragon software.  Chart creation errors have been sought, but may not always  have been located. Such creation errors do not reflect on  the standard of medical care.

## 2023-05-28 ENCOUNTER — Ambulatory Visit: Payer: BC Managed Care – PPO | Admitting: Physician Assistant

## 2023-05-28 ENCOUNTER — Ambulatory Visit: Payer: BC Managed Care – PPO | Admitting: Rheumatology

## 2023-05-28 ENCOUNTER — Inpatient Hospital Stay: Admission: RE | Admit: 2023-05-28 | Payer: BC Managed Care – PPO | Source: Ambulatory Visit

## 2023-05-28 DIAGNOSIS — Z8719 Personal history of other diseases of the digestive system: Secondary | ICD-10-CM

## 2023-05-28 DIAGNOSIS — R768 Other specified abnormal immunological findings in serum: Secondary | ICD-10-CM

## 2023-05-28 DIAGNOSIS — Z8261 Family history of arthritis: Secondary | ICD-10-CM

## 2023-05-28 DIAGNOSIS — Z79899 Other long term (current) drug therapy: Secondary | ICD-10-CM

## 2023-05-28 DIAGNOSIS — G8929 Other chronic pain: Secondary | ICD-10-CM

## 2023-05-28 DIAGNOSIS — N200 Calculus of kidney: Secondary | ICD-10-CM

## 2023-05-28 DIAGNOSIS — Z8669 Personal history of other diseases of the nervous system and sense organs: Secondary | ICD-10-CM

## 2023-05-28 DIAGNOSIS — Z8639 Personal history of other endocrine, nutritional and metabolic disease: Secondary | ICD-10-CM

## 2023-05-28 DIAGNOSIS — Z8659 Personal history of other mental and behavioral disorders: Secondary | ICD-10-CM

## 2023-05-28 DIAGNOSIS — M0579 Rheumatoid arthritis with rheumatoid factor of multiple sites without organ or systems involvement: Secondary | ICD-10-CM

## 2023-05-28 DIAGNOSIS — R160 Hepatomegaly, not elsewhere classified: Secondary | ICD-10-CM

## 2023-05-28 DIAGNOSIS — I10 Essential (primary) hypertension: Secondary | ICD-10-CM

## 2023-05-28 DIAGNOSIS — M79671 Pain in right foot: Secondary | ICD-10-CM

## 2023-05-28 DIAGNOSIS — R748 Abnormal levels of other serum enzymes: Secondary | ICD-10-CM

## 2023-05-28 DIAGNOSIS — G4709 Other insomnia: Secondary | ICD-10-CM

## 2023-10-17 ENCOUNTER — Other Ambulatory Visit: Payer: Self-pay

## 2023-10-17 MED ORDER — HYDROXYCHLOROQUINE SULFATE 200 MG PO TABS: 200.0000 mg | ORAL_TABLET | Freq: Every day | ORAL | 0 refills | Status: AC

## 2023-10-17 NOTE — Telephone Encounter (Signed)
 Last Fill: 12/20/2022  Eye exam: 05/11/2022 WNL.   Labs: 05/27/2023 RBC 2.86 Hemoglobin 8.6 Hematocrit 26.7 Platelet 494 Red Cell Dis 44 Creatine 0.57  Next Visit: Patient is out of town an will schedule when she gets back   Last Visit: 12/20/2022  IK:Myzlfjunpi arthritis with rheumatoid factor of multiple sites without organ or systems involvement   Current Dose per office note 12/20/2022: Plaquenil  200 mg 1 tablet by mouth once daily   Okay to refill Plaquenil ?   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

## 2023-12-05 ENCOUNTER — Telehealth: Payer: Self-pay

## 2023-12-05 NOTE — Telephone Encounter (Signed)
 Spoke with patient and confirmed appointment on 10/9

## 2023-12-06 ENCOUNTER — Inpatient Hospital Stay: Attending: Hematology and Oncology | Admitting: Hematology and Oncology

## 2023-12-06 ENCOUNTER — Inpatient Hospital Stay

## 2023-12-06 VITALS — BP 139/88 | HR 80 | Temp 97.9°F | Resp 16 | Wt 125.8 lb

## 2023-12-06 DIAGNOSIS — R634 Abnormal weight loss: Secondary | ICD-10-CM | POA: Insufficient documentation

## 2023-12-06 DIAGNOSIS — R5383 Other fatigue: Secondary | ICD-10-CM

## 2023-12-06 DIAGNOSIS — G4730 Sleep apnea, unspecified: Secondary | ICD-10-CM | POA: Diagnosis not present

## 2023-12-06 DIAGNOSIS — D509 Iron deficiency anemia, unspecified: Secondary | ICD-10-CM

## 2023-12-06 DIAGNOSIS — D709 Neutropenia, unspecified: Secondary | ICD-10-CM

## 2023-12-06 DIAGNOSIS — M069 Rheumatoid arthritis, unspecified: Secondary | ICD-10-CM | POA: Diagnosis not present

## 2023-12-06 LAB — CMP (CANCER CENTER ONLY)
ALT: 13 U/L (ref 0–44)
AST: 25 U/L (ref 15–41)
Albumin: 4.6 g/dL (ref 3.5–5.0)
Alkaline Phosphatase: 112 U/L (ref 38–126)
Anion gap: 7 (ref 5–15)
BUN: 20 mg/dL (ref 6–20)
CO2: 31 mmol/L (ref 22–32)
Calcium: 10.1 mg/dL (ref 8.9–10.3)
Chloride: 104 mmol/L (ref 98–111)
Creatinine: 0.64 mg/dL (ref 0.44–1.00)
GFR, Estimated: >60 mL/min (ref >60)
Glucose, Bld: 82 mg/dL (ref 70–99)
Potassium: 3.4 mmol/L — ABNORMAL LOW (ref 3.5–5.1)
Sodium: 142 mmol/L (ref 135–145)
Total Bilirubin: 0.8 mg/dL (ref 0.0–1.2)
Total Protein: 7.9 g/dL (ref 6.5–8.1)

## 2023-12-06 LAB — TECHNOLOGIST SMEAR REVIEW

## 2023-12-06 LAB — CBC WITH DIFFERENTIAL/PLATELET
Abs Immature Granulocytes: 0.01 K/uL (ref 0.00–0.07)
Basophils Absolute: 0 K/uL (ref 0.0–0.1)
Basophils Relative: 1 %
Eosinophils Absolute: 0 K/uL (ref 0.0–0.5)
Eosinophils Relative: 0 %
HCT: 34.7 % — ABNORMAL LOW (ref 36.0–46.0)
Hemoglobin: 11.6 g/dL — ABNORMAL LOW (ref 12.0–15.0)
Immature Granulocytes: 0 %
Lymphocytes Relative: 52 %
Lymphs Abs: 1.6 K/uL (ref 0.7–4.0)
MCH: 29.6 pg (ref 26.0–34.0)
MCHC: 33.4 g/dL (ref 30.0–36.0)
MCV: 88.5 fL (ref 80.0–100.0)
Monocytes Absolute: 0.3 K/uL (ref 0.1–1.0)
Monocytes Relative: 8 %
Neutro Abs: 1.2 K/uL — ABNORMAL LOW (ref 1.7–7.7)
Neutrophils Relative %: 39 %
Platelets: 265 K/uL (ref 150–400)
RBC: 3.92 MIL/uL (ref 3.87–5.11)
RDW: 12 % (ref 11.5–15.5)
WBC: 3.2 K/uL — ABNORMAL LOW (ref 4.0–10.5)
nRBC: 0 % (ref 0.0–0.2)

## 2023-12-06 LAB — HEPATITIS PANEL, ACUTE
HCV Ab: NONREACTIVE
Hep A IgM: NONREACTIVE
Hep B C IgM: NONREACTIVE
Hepatitis B Surface Ag: NONREACTIVE

## 2023-12-06 NOTE — Progress Notes (Signed)
 [facility] CONSULT NOTE  Patient Care Team: Ziglar, Devere POUR, MD as PCP - General (Family Medicine)  CHIEF COMPLAINTS/PURPOSE OF CONSULTATION:  New diagnosis of breast cancer  ASSESSMENT & PLAN:   Assessment and Plan Assessment & Plan Iron deficiency anemia Mild iron deficiency anemia with improved hemoglobin from 8.5 g/dL to 88.7 g/dL. Ferritin remains low at 12. Oral iron effective, no IV needed. Patient prefers oral supplementation. - Continue oral iron supplementation. - Re-evaluate in 6 months. - Consider iron infusion if no further improvement in hemoglobin.  Benign chronic neutropenia Chronic neutropenia likely benign, possibly related to ethnicity or autoimmune conditions. Consistently low WBC counts suggest benign pattern. No significant infections or hospitalizations. - Evaluate for other causes of leukopenia, including hepatitis and thyroid  function. - Draw blood for folic acid , thyroid , and hepatitis testing. - If no concerns on labs today, ok to monitor.   HISTORY OF PRESENTING ILLNESS:  [name] 53 y.o. female is here because of neutropenia and anemia.  Discussed the use of AI scribe software for clinical note transcription with the patient, who gave verbal consent to proceed.  History of Present Illness [name] is a 53 year old female with anemia and rheumatoid arthritis who presents for evaluation of low white blood cell count and anemia.  She has a history of anemia, which she believes worsened following her recent brain aneurysm surgery. She has been on oral iron supplements for several years, noting an improvement in hemoglobin levels from 8.5 g/dL in March 2025 to 88.7 g/dL currently. She takes iron supplements in liquid form. Her ferritin levels remain low at 12 ng/mL.  She has a history of low white blood cell count, consistently low in lab results dating back to 2012. She does not recall having a low white count all her life  but acknowledges it has been low for several years. No frequent infections or hospitalizations related to this issue.  Her past medical history includes rheumatoid arthritis, for which she takes hydroxychloroquine , although she has not resumed it post-surgery. She also has a history of high cholesterol, high blood pressure, and a brain aneurysm, for which she underwent surgery. She had a hysterectomy due to fibroids and an oophorectomy on the right side, with the left ovary remaining intact.  No fevers, night sweats, weight loss, blood in stool, or urine. She reports a diagnosis of sleep apnea and notes that her bowel movements and urination are normal. She mentions not eating enough recently.  All other systems were reviewed with the patient and are negative.  MEDICAL HISTORY:  Past Medical History:  Diagnosis Date   Anemia    Anxiety    Arthritis    lower back   Depression    Endocervical polyp    h/o   Fibroadenoma of breast    left breast   Fibroids    h/o   GERD (gastroesophageal reflux disease)    on nexium-instructed to take dos   H/O hypercholesterolemia    History of right oophorectomy    Hypertension    controlled on hctz-b/p 142/93 at pat visit-will do ekg at pat visit   Insomnia    Liver mass 12/2007   benign- per pt  no longer there   Ovarian cyst    h/o    SURGICAL HISTORY: Past Surgical History:  Procedure Laterality Date   ABDOMINAL HYSTERECTOMY  11/29/2010   BILATERAL SALPINGECTOMY Bilateral 06/12/2012   Procedure: BILATERAL SALPINGECTOMY;  Surgeon: Shanda SHAUNNA Muscat,  MD;  Location: [facility];  Service: Gynecology;  Laterality: Bilateral;   COLON SURGERY     ECTOPIC PREGNANCY SURGERY  1992   LAPAROSCOPIC ASSISTED VAGINAL HYSTERECTOMY  11/29/2010   Procedure: LAPAROSCOPIC ASSISTED VAGINAL HYSTERECTOMY;  Surgeon: Shanda SHAUNNA Muscat, MD;  Location: [facility];  Service: Gynecology;  Laterality: N/A;   LAPAROSCOPY N/A 06/12/2012   Procedure: LAPAROSCOPY OPERATIVE;   Surgeon: Shanda SHAUNNA Muscat, MD;  Location: [facility];  Service: Gynecology;  Laterality: N/A;   MYOMECTOMY  2006    SOCIAL HISTORY: Social History   Socioeconomic History   Marital status: Single    Spouse name: Not on file   Number of children: Not on file   Years of education: Not on file   Highest education level: Not on file  Occupational History   Not on file  Tobacco Use   Smoking status: Never    Passive exposure: Current   Smokeless tobacco: Never  Vaping Use   Vaping status: Never Used  Substance and Sexual Activity   Alcohol use: Yes    Comment: occasionally (wine)   Drug use: No   Sexual activity: Not Currently    Birth control/protection: Surgical    Comment: hysterectomy   Other Topics Concern   Not on file  Social History Narrative   Not on file   Social Drivers of Health   Financial Resource Strain: Not on file  Food Insecurity: No Food Insecurity (05/14/2023)   Received from [employer] Vital Sign    Within the past 12 months, you worried that your food would run out before you got the money to buy more.: Never true    Within the past 12 months, the food you bought just didn't last and you didn't have money to get more.: Never true  Transportation Needs: No Transportation Needs (05/14/2023)   Received from [employer] - Transportation    Lack of Transportation (Medical): No    Lack of Transportation (Non-Medical): No  Physical Activity: Not on file  Stress: Not on file  Social Connections: Not on file  Intimate Partner Violence: Not At Risk (05/14/2023)   Received from [employer], Afraid, Rape, and Kick questionnaire    Within the last year, have you been afraid of your partner or ex-partner?: No    Within the last year, have you been humiliated or emotionally abused in other ways by your partner or ex-partner?: No    Within the last year, have you been kicked, hit, slapped, or otherwise physically hurt  by your partner or ex-partner?: No    Within the last year, have you been raped or forced to have any kind of sexual activity by your partner or ex-partner?: No    FAMILY HISTORY: Family History  Problem Relation Age of Onset   Rheum arthritis Mother    Heart attack Father    Pancreatic cancer Maternal Grandmother    Breast cancer Neg Hx     ALLERGIES:  is allergic to oxycodone-acetaminophen , sulfa antibiotics, tetracyclines & related, hydrocodone , oxycodone-acetaminophen , shellfish allergy, and tetracycline.  MEDICATIONS:  Current Outpatient Medications  Medication Sig Dispense Refill   albuterol (VENTOLIN HFA) 108 (90 Base) MCG/ACT inhaler INHALE 2 PUFFS BY MOUTH EVERY 4 TO 6 HOURS AS NEEDED     amLODipine (NORVASC) 5 MG tablet Take 5 mg by mouth daily.     aspirin 81 MG EC tablet Take by mouth as needed.     CARICA  PAPAYA PO Papaya Enzyme     Cholecalciferol (VITAMIN D3 PO) Vitamin D3     COLLAGEN PO Take by mouth.     Erythromycin Base (ERYTHROMYCIN PO) Take 1 tablet by mouth daily.     hydroxychloroquine  (PLAQUENIL ) 200 MG tablet Take 1 tablet (200 mg total) by mouth daily. 30 tablet 0   ibuprofen  (ADVIL ,MOTRIN ) 600 MG tablet Ibuprofen  600 mg every 6 hours for 3 days and then as needed. For pain 60 tablet 0   Prenatal Vit-Fe Fumarate-FA (PX PRENATAL MULTIVITAMINS PO) Prenatal Multivitamins     No current facility-administered medications for this visit.     PHYSICAL EXAMINATION: ECOG PERFORMANCE STATUS: 0 - Asymptomatic  Vitals:   12/06/23 1432  BP: 139/88  Pulse: 80  Resp: 16  Temp: 97.9 F (36.6 C)  SpO2: 97%   Filed Weights   12/06/23 1432  Weight: 125 lb 12.8 oz (57.1 kg)    GENERAL:alert, no distress and comfortable SKIN: skin color, texture, turgor are normal, no rashes or significant lesions EYES: normal, conjunctiva are pink and non-injected, sclera clear OROPHARYNX:no exudate, no erythema and lips, buccal mucosa, and tongue normal  NECK: supple,  thyroid  normal size, non-tender, without nodularity LYMPH:  no palpable lymphadenopathy in the cervical, axillary  LUNGS: clear to auscultation and percussion with normal breathing effort HEART: regular rate & rhythm and no murmurs and no lower extremity edema ABDOMEN:abdomen soft, non-tender and normal bowel sounds Musculoskeletal:no cyanosis of digits and no clubbing  PSYCH: alert & oriented x 3 with fluent speech NEURO: no focal motor/sensory deficits  LABORATORY DATA:  I have reviewed the data as listed Lab Results  Component Value Date   WBC 3.3 (L) 12/20/2022   HGB 11.6 (L) 12/20/2022   HCT 36.6 12/20/2022   MCV 92.2 12/20/2022   PLT 237 12/20/2022     Chemistry      Component Value Date/Time   NA 140 11/07/2022 1049   K 3.9 11/07/2022 1049   CL 103 11/07/2022 1049   CO2 29 11/07/2022 1049   BUN 17 11/07/2022 1049   CREATININE 0.64 11/07/2022 1049      Component Value Date/Time   CALCIUM 9.4 11/07/2022 1049   ALKPHOS 107 10/02/2014 1951   AST 18 11/07/2022 1049   ALT 8 11/07/2022 1049   BILITOT 0.7 11/07/2022 1049       RADIOGRAPHIC STUDIES: I have personally reviewed the radiological images as listed and agreed with the findings in the report. No results found.  All questions were answered. The patient knows to call the clinic with any problems, questions or concerns. I spent 45 minutes in the care of this patient including H and P, review of records, counseling and coordination of care.     Amber Stalls, MD 12/06/2023 2:36 PM

## 2023-12-07 LAB — FOLATE RBC
Folate, Hemolysate: 462 ng/mL
Folate, RBC: 1249 ng/mL (ref >498)
Hematocrit: 37 % (ref 34.0–46.6)

## 2023-12-07 LAB — TSH: TSH: 0.823 u[IU]/mL (ref 0.350–4.500)

## 2023-12-07 LAB — SURGICAL PATHOLOGY

## 2023-12-11 LAB — FLOW CYTOMETRY

## 2023-12-13 ENCOUNTER — Other Ambulatory Visit: Payer: Self-pay | Admitting: Family Medicine

## 2023-12-13 DIAGNOSIS — Z1231 Encounter for screening mammogram for malignant neoplasm of breast: Secondary | ICD-10-CM

## 2023-12-14 ENCOUNTER — Ambulatory Visit
Admission: RE | Admit: 2023-12-14 | Discharge: 2023-12-14 | Disposition: A | Source: Ambulatory Visit | Attending: Family Medicine | Admitting: Family Medicine

## 2023-12-14 DIAGNOSIS — Z1231 Encounter for screening mammogram for malignant neoplasm of breast: Secondary | ICD-10-CM

## 2023-12-24 ENCOUNTER — Inpatient Hospital Stay: Admitting: Hematology and Oncology

## 2023-12-24 DIAGNOSIS — D509 Iron deficiency anemia, unspecified: Secondary | ICD-10-CM | POA: Insufficient documentation

## 2023-12-24 NOTE — Progress Notes (Signed)
 [facility] CONSULT NOTE  Patient Care Team: Ziglar, Devere POUR, MD as PCP - General (Family Medicine)  CHIEF COMPLAINTS/PURPOSE OF CONSULTATION:  New diagnosis of breast cancer  ASSESSMENT & PLAN:   Assessment and Plan Assessment & Plan Iron deficiency anemia Mild iron deficiency anemia with improved hemoglobin from 8.5 g/dL to 88.7 g/dL.  We discussed about oral iron but she said she can't afford it, she is not able to work and she hasn't gotten paid in a month. She says she is still tired and If IV iron is covered, she would rather do this. I ordered ferraheme to see if this will be covered I also encouraged her to call her insurance to find out her expected costs. She expressed understanding Once again, I think its reasonable to consider IV iron, but I told her her insurance will determine the cost to her. So this is her responsibility to find out how much it costs to her.  Benign chronic neutropenia Chronic neutropenia likely benign, possibly related to ethnicity or autoimmune conditions.  Most recent labs including flow neg for any clonality Ok to monitor this.  RTC in 6 months.   HISTORY OF PRESENTING ILLNESS:  [name] 53 y.o. female is here because of neutropenia and anemia.  Discussed the use of AI scribe software for clinical note transcription with the patient, who gave verbal consent to proceed.  History of Present Illness [name] is a 53 year old female with anemia and rheumatoid arthritis who presents for evaluation of low white blood cell count and anemia.  This is a follow up telephone visit. She says she is not getting paid and she is not able to take the oral iron, she couldn't buy the pill. She was hoping we may be able to give her IV iron.  All other systems were reviewed with the patient and are negative.  MEDICAL HISTORY:  Past Medical History:  Diagnosis Date   Anemia    Anxiety    Arthritis    lower back    Depression    Endocervical polyp    h/o   Fibroadenoma of breast    left breast   Fibroids    h/o   GERD (gastroesophageal reflux disease)    on nexium-instructed to take dos   H/O hypercholesterolemia    History of right oophorectomy    Hypertension    controlled on hctz-b/p 142/93 at pat visit-will do ekg at pat visit   Insomnia    Liver mass 12/2007   benign- per pt  no longer there   Ovarian cyst    h/o    SURGICAL HISTORY: Past Surgical History:  Procedure Laterality Date   ABDOMINAL HYSTERECTOMY  11/29/2010   BILATERAL SALPINGECTOMY Bilateral 06/12/2012   Procedure: BILATERAL SALPINGECTOMY;  Surgeon: Shanda SHAUNNA Muscat, MD;  Location: [facility];  Service: Gynecology;  Laterality: Bilateral;   COLON SURGERY     ECTOPIC PREGNANCY SURGERY  1992   LAPAROSCOPIC ASSISTED VAGINAL HYSTERECTOMY  11/29/2010   Procedure: LAPAROSCOPIC ASSISTED VAGINAL HYSTERECTOMY;  Surgeon: Shanda SHAUNNA Muscat, MD;  Location: [facility];  Service: Gynecology;  Laterality: N/A;   LAPAROSCOPY N/A 06/12/2012   Procedure: LAPAROSCOPY OPERATIVE;  Surgeon: Shanda SHAUNNA Muscat, MD;  Location: [facility];  Service: Gynecology;  Laterality: N/A;   MYOMECTOMY  2006    SOCIAL HISTORY: Social History   Socioeconomic History   Marital status: Single    Spouse name: Not on file   Number of  children: Not on file   Years of education: Not on file   Highest education level: Not on file  Occupational History   Not on file  Tobacco Use   Smoking status: Never    Passive exposure: Current   Smokeless tobacco: Never  Vaping Use   Vaping status: Never Used  Substance and Sexual Activity   Alcohol use: Yes    Comment: occasionally (wine)   Drug use: No   Sexual activity: Not Currently    Birth control/protection: Surgical    Comment: hysterectomy   Other Topics Concern   Not on file  Social History Narrative   Not on file   Social Drivers of Health   Financial Resource Strain: Not on file  Food Insecurity: No  Food Insecurity (05/14/2023)   Received from [employer] Vital Sign    Within the past 12 months, you worried that your food would run out before you got the money to buy more.: Never true    Within the past 12 months, the food you bought just didn't last and you didn't have money to get more.: Never true  Transportation Needs: No Transportation Needs (05/14/2023)   Received from [employer] - Transportation    Lack of Transportation (Medical): No    Lack of Transportation (Non-Medical): No  Physical Activity: Not on file  Stress: Not on file  Social Connections: Not on file  Intimate Partner Violence: Not At Risk (05/14/2023)   Received from [employer], Afraid, Rape, and Kick questionnaire    Within the last year, have you been afraid of your partner or ex-partner?: No    Within the last year, have you been humiliated or emotionally abused in other ways by your partner or ex-partner?: No    Within the last year, have you been kicked, hit, slapped, or otherwise physically hurt by your partner or ex-partner?: No    Within the last year, have you been raped or forced to have any kind of sexual activity by your partner or ex-partner?: No    FAMILY HISTORY: Family History  Problem Relation Age of Onset   Rheum arthritis Mother    Heart attack Father    Pancreatic cancer Maternal Grandmother    Breast cancer Neg Hx     ALLERGIES:  is allergic to oxycodone-acetaminophen , sulfa antibiotics, tetracyclines & related, hydrocodone , oxycodone-acetaminophen , shellfish allergy, and tetracycline.  MEDICATIONS:  Current Outpatient Medications  Medication Sig Dispense Refill   albuterol (VENTOLIN HFA) 108 (90 Base) MCG/ACT inhaler INHALE 2 PUFFS BY MOUTH EVERY 4 TO 6 HOURS AS NEEDED     amLODipine (NORVASC) 5 MG tablet Take 5 mg by mouth daily.     aspirin 81 MG EC tablet Take by mouth as needed.     CARICA PAPAYA PO Papaya Enzyme      Cholecalciferol (VITAMIN D3 PO) Vitamin D3     COLLAGEN PO Take by mouth.     Erythromycin Base (ERYTHROMYCIN PO) Take 1 tablet by mouth daily.     hydroxychloroquine  (PLAQUENIL ) 200 MG tablet Take 1 tablet (200 mg total) by mouth daily. 30 tablet 0   ibuprofen  (ADVIL ,MOTRIN ) 600 MG tablet Ibuprofen  600 mg every 6 hours for 3 days and then as needed. For pain 60 tablet 0   Prenatal Vit-Fe Fumarate-FA (PX PRENATAL MULTIVITAMINS PO) Prenatal Multivitamins     No current facility-administered medications for this visit.     PHYSICAL EXAMINATION: ECOG PERFORMANCE STATUS:  0 - Asymptomatic  There were no vitals filed for this visit.  There were no vitals filed for this visit.   GENERAL:alert, no distress and comfortable SKIN: skin color, texture, turgor are normal, no rashes or significant lesions EYES: normal, conjunctiva are pink and non-injected, sclera clear OROPHARYNX:no exudate, no erythema and lips, buccal mucosa, and tongue normal  NECK: supple, thyroid normal size, non-tender, without nodularity LYMPH:  no palpable lymphadenopathy in the cervical, axillary  LUNGS: clear to auscultation and percussion with normal breathing effort HEART: regular rate & rhythm and no murmurs and no lower extremity edema ABDOMEN:abdomen soft, non-tender and normal bowel sounds Musculoskeletal:no cyanosis of digits and no clubbing  PSYCH: alert & oriented x 3 with fluent speech NEURO: no focal motor/sensory deficits  LABORATORY DATA:  I have reviewed the data as listed Lab Results  Component Value Date   WBC 3.2 (L) 12/06/2023   HGB 11.6 (L) 12/06/2023   HCT 37.0 12/06/2023   HCT 34.7 (L) 12/06/2023   MCV 88.5 12/06/2023   PLT 265 12/06/2023     Chemistry      Component Value Date/Time   NA 142 12/06/2023 1509   K 3.4 (L) 12/06/2023 1509   CL 104 12/06/2023 1509   CO2 31 12/06/2023 1509   BUN 20 12/06/2023 1509   CREATININE 0.64 12/06/2023 1509   CREATININE 0.64 11/07/2022 1049       Component Value Date/Time   CALCIUM 10.1 12/06/2023 1509   ALKPHOS 112 12/06/2023 1509   AST 25 12/06/2023 1509   ALT 13 12/06/2023 1509   BILITOT 0.8 12/06/2023 1509       RADIOGRAPHIC STUDIES: I have personally reviewed the radiological images as listed and agreed with the findings in the report. MM 3D SCREENING MAMMOGRAM BILATERAL BREAST Result Date: 12/19/2023 CLINICAL DATA:  Screening. EXAM: DIGITAL SCREENING BILATERAL MAMMOGRAM WITH TOMOSYNTHESIS AND CAD TECHNIQUE: Bilateral screening digital craniocaudal and mediolateral oblique mammograms were obtained. Bilateral screening digital breast tomosynthesis was performed. The images were evaluated with computer-aided detection. COMPARISON:  Previous exam(s). ACR Breast Density Category b: There are scattered areas of fibroglandular density. FINDINGS: There are no findings suspicious for malignancy. IMPRESSION: No mammographic evidence of malignancy. A result letter of this screening mammogram will be mailed directly to the patient. RECOMMENDATION: Screening mammogram in one year. (Code:SM-B-01Y) BI-RADS CATEGORY  1: Negative. Electronically Signed   By: Curtistine Noble   On: 12/19/2023 09:25    All questions were answered. The patient knows to call the clinic with any problems, questions or concerns. I spent 20 minutes in the care of this patient including H and P, review of records, counseling and coordination of care.  I connected with  [name] on 12/24/23 by a telephone application and verified that I am speaking with the correct person using two identifiers.   I discussed the limitations of evaluation and management by telemedicine. The patient expressed understanding and agreed to proceed.  Location of pt: home Location of provider : office    Amber Stalls, MD 12/24/2023 1:10 PM

## 2023-12-31 ENCOUNTER — Ambulatory Visit

## 2024-01-03 ENCOUNTER — Ambulatory Visit

## 2024-01-10 ENCOUNTER — Other Ambulatory Visit (HOSPITAL_COMMUNITY): Payer: Self-pay | Admitting: Hematology and Oncology

## 2024-01-10 ENCOUNTER — Ambulatory Visit (INDEPENDENT_AMBULATORY_CARE_PROVIDER_SITE_OTHER): Admitting: *Deleted

## 2024-01-10 VITALS — BP 121/77 | HR 75 | Temp 98.2°F | Resp 14 | Ht 66.0 in | Wt 123.8 lb

## 2024-01-10 DIAGNOSIS — D509 Iron deficiency anemia, unspecified: Secondary | ICD-10-CM | POA: Diagnosis not present

## 2024-01-10 MED ORDER — SODIUM CHLORIDE 0.9 % IV SOLN
510.0000 mg | Freq: Once | INTRAVENOUS | Status: DC
  Filled 2024-01-10: qty 17

## 2024-01-10 MED ORDER — ACETAMINOPHEN 325 MG PO TABS: 650.0000 mg | ORAL_TABLET | Freq: Once | ORAL | Status: DC

## 2024-01-10 MED ORDER — SODIUM CHLORIDE 0.9 % IV SOLN
510.0000 mg | Freq: Once | INTRAVENOUS | Status: AC
  Administered 2024-01-10: 510 mg via INTRAVENOUS
  Filled 2024-01-10: qty 17

## 2024-01-10 MED ORDER — ACETAMINOPHEN 325 MG PO TABS
650.0000 mg | ORAL_TABLET | Freq: Once | ORAL | Status: AC
  Administered 2024-01-10: 650 mg via ORAL
  Filled 2024-01-10: qty 2

## 2024-01-10 MED ORDER — FAMOTIDINE IN NACL 20-0.9 MG/50ML-% IV SOLN: 20.0000 mg | Freq: Once | INTRAVENOUS | Status: DC

## 2024-01-10 MED ORDER — FAMOTIDINE IN NACL 20-0.9 MG/50ML-% IV SOLN
20.0000 mg | Freq: Once | INTRAVENOUS | Status: AC
  Administered 2024-01-10: 20 mg via INTRAVENOUS
  Filled 2024-01-10: qty 50

## 2024-01-10 NOTE — Progress Notes (Signed)
 Diagnosis: Iron Deficiency Anemia  Provider:  Mannam, Praveen MD  Procedure: IV Infusion  IV Type: Peripheral, IV Location: L Antecubital  Feraheme (Ferumoxytol), Dose: 510 mg  Infusion Start Time: 1532 pm  Infusion Stop Time: 1552 pm  Post Infusion IV Care: Observation period completed and Peripheral IV Discontinued  Discharge: Condition: Good, Destination: Home . AVS Provided  Performed by:  Trudy Lamarr LABOR, RN

## 2024-01-10 NOTE — Telephone Encounter (Signed)
 Forms faxed to [facility] at  [phone] [facility]   Stroke Clinic OV note faxed  to [facility] 01/09/2024

## 2024-01-10 NOTE — Patient Instructions (Signed)

## 2024-01-11 ENCOUNTER — Telehealth: Payer: Self-pay | Admitting: Pharmacy Technician

## 2024-01-11 NOTE — Telephone Encounter (Addendum)
 Auth Submission: NO AUTH NEEDED Site of care: Site of care: [facility] Payer: MEDICARE A/B BCBS IS IN-ACTIVE Medication & CPT/J Code(s) submitted: Feraheme (ferumoxytol) [id number] Diagnosis Code: D50.9 Route of submission (phone, fax, portal):  Phone # Fax # Auth type: Buy/Bill PB Units/visits requested: X2 DOSES Reference number:  Approval from: 01/10/24 to 03/30/23

## 2024-01-17 ENCOUNTER — Ambulatory Visit (INDEPENDENT_AMBULATORY_CARE_PROVIDER_SITE_OTHER)

## 2024-01-17 VITALS — BP 126/76 | HR 80 | Temp 98.3°F | Resp 16 | Ht 60.0 in | Wt 123.6 lb

## 2024-01-17 DIAGNOSIS — D509 Iron deficiency anemia, unspecified: Secondary | ICD-10-CM

## 2024-01-17 MED ORDER — FAMOTIDINE IN NACL 20-0.9 MG/50ML-% IV SOLN
20.0000 mg | Freq: Once | INTRAVENOUS | Status: AC
  Administered 2024-01-17: 20 mg via INTRAVENOUS
  Filled 2024-01-17: qty 50

## 2024-01-17 MED ORDER — ACETAMINOPHEN 325 MG PO TABS
650.0000 mg | ORAL_TABLET | Freq: Once | ORAL | Status: AC
  Administered 2024-01-17: 650 mg via ORAL
  Filled 2024-01-17: qty 2

## 2024-01-17 MED ORDER — SODIUM CHLORIDE 0.9 % IV SOLN
510.0000 mg | Freq: Once | INTRAVENOUS | Status: AC
  Administered 2024-01-17: 510 mg via INTRAVENOUS
  Filled 2024-01-17: qty 17

## 2024-01-17 NOTE — Progress Notes (Signed)
 Diagnosis: Iron Deficiency Anemia  Provider:  Praveen Mannam MD  Procedure: IV Infusion  IV Type: Peripheral, IV Location: L Antecubital   Pepcid  (Famotidine ), Dose: 20 mg  Infusion Start Time: 1403  Infusion Stop Time: 1431  Feraheme  (Ferumoxytol ), Dose: 510 mg  Infusion Start Time: 1434  Infusion Stop Time: 1451  Post Infusion IV Care: Observation period completed and Peripheral IV Discontinued  Discharge: Condition: Good, Destination: Home . AVS Declined  Performed by:  Gurley Climer, RN

## 2024-06-06 ENCOUNTER — Ambulatory Visit: Admitting: Hematology and Oncology

## 2024-06-06 ENCOUNTER — Other Ambulatory Visit
